# Patient Record
Sex: Male | Born: 1970 | Race: Black or African American | Hispanic: No | Marital: Married | Smoking: Never smoker
Health system: Southern US, Community
[De-identification: ages and names within clinical notes are randomized; demographics above are authoritative.]

## PROBLEM LIST (undated history)

## (undated) DIAGNOSIS — R0789 Other chest pain: Secondary | ICD-10-CM

## (undated) DIAGNOSIS — M778 Other enthesopathies, not elsewhere classified: Secondary | ICD-10-CM

## (undated) DIAGNOSIS — L80 Vitiligo: Secondary | ICD-10-CM

## (undated) DIAGNOSIS — M5431 Sciatica, right side: Secondary | ICD-10-CM

## (undated) DIAGNOSIS — R7303 Prediabetes: Secondary | ICD-10-CM

## (undated) DIAGNOSIS — K219 Gastro-esophageal reflux disease without esophagitis: Secondary | ICD-10-CM

## (undated) HISTORY — DX: Sciatica, right side: M54.31

## (undated) HISTORY — DX: Prediabetes: R73.03

## (undated) HISTORY — DX: Vitiligo: L80

## (undated) HISTORY — PX: KNEE SURGERY: SHX244

## (undated) HISTORY — DX: Other chest pain: R07.89

## (undated) HISTORY — DX: Other enthesopathies, not elsewhere classified: M77.8

---

## 2014-01-10 ENCOUNTER — Emergency Department (HOSPITAL_COMMUNITY): Payer: 59

## 2014-01-10 ENCOUNTER — Emergency Department (HOSPITAL_COMMUNITY)
Admission: EM | Admit: 2014-01-10 | Discharge: 2014-01-10 | Disposition: A | Discharge status: Home / Self Care | Payer: 59 | Attending: Emergency Medicine | Admitting: Emergency Medicine

## 2014-01-10 ENCOUNTER — Encounter (HOSPITAL_COMMUNITY): Payer: Self-pay | Admitting: Emergency Medicine

## 2014-01-10 DIAGNOSIS — Z79899 Other long term (current) drug therapy: Secondary | ICD-10-CM | POA: Insufficient documentation

## 2014-01-10 DIAGNOSIS — Z880 Allergy status to penicillin: Secondary | ICD-10-CM | POA: Insufficient documentation

## 2014-01-10 DIAGNOSIS — M79622 Pain in left upper arm: Secondary | ICD-10-CM

## 2014-01-10 DIAGNOSIS — R209 Unspecified disturbances of skin sensation: Secondary | ICD-10-CM | POA: Insufficient documentation

## 2014-01-10 DIAGNOSIS — R110 Nausea: Secondary | ICD-10-CM | POA: Insufficient documentation

## 2014-01-10 DIAGNOSIS — K219 Gastro-esophageal reflux disease without esophagitis: Secondary | ICD-10-CM | POA: Insufficient documentation

## 2014-01-10 DIAGNOSIS — R0602 Shortness of breath: Secondary | ICD-10-CM | POA: Insufficient documentation

## 2014-01-10 DIAGNOSIS — M25519 Pain in unspecified shoulder: Secondary | ICD-10-CM | POA: Insufficient documentation

## 2014-01-10 HISTORY — DX: Gastro-esophageal reflux disease without esophagitis: K21.9

## 2014-01-10 LAB — CBC WITH DIFFERENTIAL/PLATELET
Basophils Absolute: 0 10*3/uL (ref 0.0–0.1)
Basophils Relative: 0 % (ref 0–1)
Eosinophils Absolute: 0.1 10*3/uL (ref 0.0–0.7)
Eosinophils Relative: 1 % (ref 0–5)
HEMATOCRIT: 38.2 % — AB (ref 39.0–52.0)
HEMOGLOBIN: 12.4 g/dL — AB (ref 13.0–17.0)
LYMPHS ABS: 2.4 10*3/uL (ref 0.7–4.0)
Lymphocytes Relative: 30 % (ref 12–46)
MCH: 25.8 pg — ABNORMAL LOW (ref 26.0–34.0)
MCHC: 32.5 g/dL (ref 30.0–36.0)
MCV: 79.6 fL (ref 78.0–100.0)
MONO ABS: 0.6 10*3/uL (ref 0.1–1.0)
MONOS PCT: 7 % (ref 3–12)
NEUTROS ABS: 4.9 10*3/uL (ref 1.7–7.7)
NEUTROS PCT: 62 % (ref 43–77)
Platelets: 281 10*3/uL (ref 150–400)
RBC: 4.8 MIL/uL (ref 4.22–5.81)
RDW: 15.5 % (ref 11.5–15.5)
WBC: 8 10*3/uL (ref 4.0–10.5)

## 2014-01-10 LAB — COMPREHENSIVE METABOLIC PANEL
ALBUMIN: 3.9 g/dL (ref 3.5–5.2)
ALT: 10 U/L (ref 0–53)
AST: 23 U/L (ref 0–37)
Alkaline Phosphatase: 107 U/L (ref 39–117)
BILIRUBIN TOTAL: 0.3 mg/dL (ref 0.3–1.2)
BUN: 14 mg/dL (ref 6–23)
CHLORIDE: 101 meq/L (ref 96–112)
CO2: 24 mEq/L (ref 19–32)
Calcium: 9.2 mg/dL (ref 8.4–10.5)
Creatinine, Ser: 1.22 mg/dL (ref 0.50–1.35)
GFR, EST AFRICAN AMERICAN: 83 mL/min — AB (ref >90)
GFR, EST NON AFRICAN AMERICAN: 72 mL/min — AB (ref >90)
GLUCOSE: 101 mg/dL — AB (ref 70–99)
Potassium: 4.3 mEq/L (ref 3.7–5.3)
Sodium: 139 mEq/L (ref 137–147)
Total Protein: 7.7 g/dL (ref 6.0–8.3)

## 2014-01-10 LAB — TROPONIN I

## 2014-01-10 MED ORDER — NITROGLYCERIN 0.4 MG SL SUBL
0.4000 mg | SUBLINGUAL_TABLET | Freq: Once | SUBLINGUAL | Status: AC
  Administered 2014-01-10: 0.4 mg via SUBLINGUAL
  Filled 2014-01-10: qty 25

## 2014-01-10 MED ORDER — PANTOPRAZOLE SODIUM 20 MG PO TBEC: 20.0000 mg | DELAYED_RELEASE_TABLET | Freq: Two times a day (BID) | ORAL | Status: DC

## 2014-01-10 NOTE — ED Provider Notes (Signed)
CSN: [id number]     Arrival date & time 01/10/14  1711 History   First MD Initiated Contact with Patient 01/10/14 1815     Chief Complaint  Patient presents with  . Arm Pain  . episodes of numbness    (Consider location/radiation/quality/duration/timing/severity/associated sxs/prior Treatment) Patient is a 42 y.o. male presenting with arm pain. The history is provided by the patient and the spouse. No language interpreter was used.  Arm Pain This is a new problem. The current episode started 1 to 4 weeks ago. Episode frequency: Pain has been intermittent until 10 days ago when it became more constant.  Associated symptoms include nausea. Pertinent negatives include no abdominal pain, chest pain, chills, diaphoresis, fever or vomiting. Associated symptoms comments: Left arm aching for the past several weeks. He denies chest pain but reports SOB and nausea. Symptoms can occur at rest or while active. No history of same. He takes medication for GERD and has has increased symptoms of heartburn as well. No cough or fever. .    Past Medical History  Diagnosis Date  . GERD (gastroesophageal reflux disease)    History reviewed. No pertinent past surgical history. History reviewed. No pertinent family history. History  Substance Use Topics  . Smoking status: Never Smoker   . Smokeless tobacco: Not on file  . Alcohol Use: Yes     Comment: socially    Review of Systems  Constitutional: Negative for fever, chills and diaphoresis.  Respiratory: Positive for shortness of breath.   Cardiovascular: Negative.  Negative for chest pain and palpitations.  Gastrointestinal: Positive for nausea. Negative for vomiting and abdominal pain.  Musculoskeletal:       See HPI.  Skin: Negative.   Neurological: Negative.     Allergies  Penicillins  Home Medications   Current Outpatient Rx  Name  Route  Sig  Dispense  Refill  . lansoprazole (PREVACID) 30 MG capsule   Oral   Take 30 mg by mouth daily  at 12 noon.         . pantoprazole (PROTONIX) 40 MG injection   Intravenous   Inject 40 mg into the vein every morning.          BP 131/75  Pulse 92  Temp(Src) 98.3 F (36.8 C) (Oral)  Resp 16  SpO2 96% Physical Exam  Constitutional: He is oriented to person, place, and time. He appears well-developed and well-nourished. No distress.  HENT:  Head: Normocephalic.  Neck: Normal range of motion. Neck supple.  Cardiovascular: Normal rate and regular rhythm.   Pulmonary/Chest: Effort normal and breath sounds normal. He has no wheezes. He has no rales. He exhibits no tenderness.  Abdominal: Soft. Bowel sounds are normal. There is no tenderness. There is no rebound and no guarding.  Musculoskeletal: Normal range of motion.  FROM Left UE. No swelling or discoloration.  Neurological: He is alert and oriented to person, place, and time.  Skin: Skin is warm and dry. No rash noted.  Psychiatric: He has a normal mood and affect.    ED Course  Procedures (including critical care time) Labs Review Labs Reviewed  CBC WITH DIFFERENTIAL - Abnormal; Notable for the following:    Hemoglobin 12.4 (*)    HCT 38.2 (*)    MCH 25.8 (*)    All other components within normal limits  COMPREHENSIVE METABOLIC PANEL - Abnormal; Notable for the following:    Glucose, Bld 101 (*)    GFR calc non Af Amer 72 (*)  GFR calc Af Amer 83 (*)    All other components within normal limits  TROPONIN I   Results for orders placed during the hospital encounter of 01/10/14  CBC WITH DIFFERENTIAL      Result Value Range   WBC 8.0  4.0 - 10.5 K/uL   RBC 4.80  4.22 - 5.81 MIL/uL   Hemoglobin 12.4 (*) 13.0 - 17.0 g/dL   HCT 38.2 (*) 39.0 - 52.0 %   MCV 79.6  78.0 - 100.0 fL   MCH 25.8 (*) 26.0 - 34.0 pg   MCHC 32.5  30.0 - 36.0 g/dL   RDW 15.5  11.5 - 15.5 %   Platelets 281  150 - 400 K/uL   Neutrophils Relative % 62  43 - 77 %   Neutro Abs 4.9  1.7 - 7.7 K/uL   Lymphocytes Relative 30  12 - 46 %    Lymphs Abs 2.4  0.7 - 4.0 K/uL   Monocytes Relative 7  3 - 12 %   Monocytes Absolute 0.6  0.1 - 1.0 K/uL   Eosinophils Relative 1  0 - 5 %   Eosinophils Absolute 0.1  0.0 - 0.7 K/uL   Basophils Relative 0  0 - 1 %   Basophils Absolute 0.0  0.0 - 0.1 K/uL  COMPREHENSIVE METABOLIC PANEL      Result Value Range   Sodium 139  137 - 147 mEq/L   Potassium 4.3  3.7 - 5.3 mEq/L   Chloride 101  96 - 112 mEq/L   CO2 24  19 - 32 mEq/L   Glucose, Bld 101 (*) 70 - 99 mg/dL   BUN 14  6 - 23 mg/dL   Creatinine, Ser 1.22  0.50 - 1.35 mg/dL   Calcium 9.2  8.4 - 10.5 mg/dL   Total Protein 7.7  6.0 - 8.3 g/dL   Albumin 3.9  3.5 - 5.2 g/dL   AST 23  0 - 37 U/L   ALT 10  0 - 53 U/L   Alkaline Phosphatase 107  39 - 117 U/L   Total Bilirubin 0.3  0.3 - 1.2 mg/dL   GFR calc non Af Amer 72 (*) >90 mL/min   GFR calc Af Amer 83 (*) >90 mL/min  TROPONIN I      Result Value Range   Troponin I <0.30  <0.30 ng/mL    Imaging Review Dg Chest Portable 1 View  01/10/2014   CLINICAL DATA:  ARM PAIN  EXAM: PORTABLE CHEST - 1 VIEW  COMPARISON:  None.  FINDINGS: Normal mediastinum and cardiac silhouette. Normal pulmonary vasculature. No evidence of effusion, infiltrate, or pneumothorax. No acute bony abnormality.  IMPRESSION: No acute cardiopulmonary process.   Electronically Signed   By: Suzy Bouchard M.D.   On: 01/10/2014 18:45    EKG Interpretation   None       MDM  No diagnosis found. 1. Left UE pain  Symptoms for over one week that atypical for CAD, negative troponin, EKG - doubt ACS. Heart score 1. VSS. Discussed with Dr. Audie Pinto. Will discharge home to follow up with PCP.  Dewaine Oats, PA-C 01/10/14 2036

## 2014-01-10 NOTE — ED Notes (Signed)
Pt reports left arm pain x1 week, first in shoulder and now located in left arm/elbow. Pt also has been experiencing dizziness when rising from sitting position, and numbness in extremities x6 months, has not been seen by doctor for any symptoms. Arm pain 8/10.

## 2014-01-10 NOTE — Discharge Instructions (Signed)

## 2014-01-10 NOTE — ED Notes (Signed)
On assessment patient states he has been SOB on exertion, when he bend over and lay down. Patient wife states she noticed him gasping for air in the night.

## 2014-10-21 ENCOUNTER — Ambulatory Visit (HOSPITAL_COMMUNITY)
Admission: RE | Admit: 2014-10-21 | Discharge: 2014-10-21 | Disposition: A | Discharge status: Home / Self Care | Payer: 59 | Source: Ambulatory Visit | Attending: Internal Medicine | Admitting: Internal Medicine

## 2014-10-21 ENCOUNTER — Other Ambulatory Visit (HOSPITAL_COMMUNITY): Payer: Self-pay | Admitting: Internal Medicine

## 2014-10-21 DIAGNOSIS — R52 Pain, unspecified: Secondary | ICD-10-CM

## 2014-10-21 DIAGNOSIS — M25551 Pain in right hip: Secondary | ICD-10-CM | POA: Insufficient documentation

## 2018-06-02 ENCOUNTER — Ambulatory Visit (INDEPENDENT_AMBULATORY_CARE_PROVIDER_SITE_OTHER): Payer: Self-pay | Admitting: Urgent Care

## 2018-06-02 ENCOUNTER — Other Ambulatory Visit: Payer: Self-pay | Admitting: *Deleted

## 2018-06-02 ENCOUNTER — Encounter: Payer: Self-pay | Admitting: Urgent Care

## 2018-06-02 VITALS — BP 173/80 | HR 80 | Temp 98.1°F | Resp 18 | Ht >= 80 in | Wt 375.4 lb

## 2018-06-02 DIAGNOSIS — R030 Elevated blood-pressure reading, without diagnosis of hypertension: Secondary | ICD-10-CM

## 2018-06-02 DIAGNOSIS — Z024 Encounter for examination for driving license: Secondary | ICD-10-CM

## 2018-06-02 NOTE — Patient Instructions (Addendum)
Hypertension Hypertension, commonly called high blood pressure, is when the force of blood pumping through the arteries is too strong. The arteries are the blood vessels that carry blood from the heart throughout the body. Hypertension forces the heart to work harder to pump blood and may cause arteries to become narrow or stiff. Having untreated or uncontrolled hypertension can cause heart attacks, strokes, kidney disease, and other problems. A blood pressure reading consists of a higher number over a lower number. Ideally, your blood pressure should be below 120/80. The first ("top") number is called the systolic pressure. It is a measure of the pressure in your arteries as your heart beats. The second ("bottom") number is called the diastolic pressure. It is a measure of the pressure in your arteries as the heart relaxes. What are the causes? The cause of this condition is not known. What increases the risk? Some risk factors for high blood pressure are under your control. Others are not. Factors you can change  Smoking.  Having type 2 diabetes mellitus, high cholesterol, or both.  Not getting enough exercise or physical activity.  Being overweight.  Having too much fat, sugar, calories, or salt (sodium) in your diet.  Drinking too much alcohol. Factors that are difficult or impossible to change  Having chronic kidney disease.  Having a family history of high blood pressure.  Age. Risk increases with age.  Race. You may be at higher risk if you are African-American.  Gender. Men are at higher risk than women before age 45. After age 65, women are at higher risk than men.  Having obstructive sleep apnea.  Stress. What are the signs or symptoms? Extremely high blood pressure (hypertensive crisis) may cause:  Headache.  Anxiety.  Shortness of breath.  Nosebleed.  Nausea and vomiting.  Severe chest pain.  Jerky movements you cannot control (seizures).  How is this  diagnosed? This condition is diagnosed by measuring your blood pressure while you are seated, with your arm resting on a surface. The cuff of the blood pressure monitor will be placed directly against the skin of your upper arm at the level of your heart. It should be measured at least twice using the same arm. Certain conditions can cause a difference in blood pressure between your right and left arms. Certain factors can cause blood pressure readings to be lower or higher than normal (elevated) for a short period of time:  When your blood pressure is higher when you are in a health care provider's office than when you are at home, this is called white coat hypertension. Most people with this condition do not need medicines.  When your blood pressure is higher at home than when you are in a health care provider's office, this is called masked hypertension. Most people with this condition may need medicines to control blood pressure.  If you have a high blood pressure reading during one visit or you have normal blood pressure with other risk factors:  You may be asked to return on a different day to have your blood pressure checked again.  You may be asked to monitor your blood pressure at home for 1 week or longer.  If you are diagnosed with hypertension, you may have other blood or imaging tests to help your health care provider understand your overall risk for other conditions. How is this treated? This condition is treated by making healthy lifestyle changes, such as eating healthy foods, exercising more, and reducing your alcohol intake. Your   health care provider may prescribe medicine if lifestyle changes are not enough to get your blood pressure under control, and if:  Your systolic blood pressure is above 130.  Your diastolic blood pressure is above 80.  Your personal target blood pressure may vary depending on your medical conditions, your age, and other factors. Follow these  instructions at home: Eating and drinking  Eat a diet that is high in fiber and potassium, and low in sodium, added sugar, and fat. An example eating plan is called the DASH (Dietary Approaches to Stop Hypertension) diet. To eat this way: ? Eat plenty of fresh fruits and vegetables. Try to fill half of your plate at each meal with fruits and vegetables. ? Eat whole grains, such as whole wheat pasta, brown rice, or whole grain bread. Fill about one quarter of your plate with whole grains. ? Eat or drink low-fat dairy products, such as skim milk or low-fat yogurt. ? Avoid fatty cuts of meat, processed or cured meats, and poultry with skin. Fill about one quarter of your plate with lean proteins, such as fish, chicken without skin, beans, eggs, and tofu. ? Avoid premade and processed foods. These tend to be higher in sodium, added sugar, and fat.  Reduce your daily sodium intake. Most people with hypertension should eat less than 1,500 mg of sodium a day.  Limit alcohol intake to no more than 1 drink a day for nonpregnant women and 2 drinks a day for men. One drink equals 12 oz of beer, 5 oz of wine, or 1 oz of hard liquor. Lifestyle  Work with your health care provider to maintain a healthy body weight or to lose weight. Ask what an ideal weight is for you.  Get at least 30 minutes of exercise that causes your heart to beat faster (aerobic exercise) most days of the week. Activities may include walking, swimming, or biking.  Include exercise to strengthen your muscles (resistance exercise), such as pilates or lifting weights, as part of your weekly exercise routine. Try to do these types of exercises for 30 minutes at least 3 days a week.  Do not use any products that contain nicotine or tobacco, such as cigarettes and e-cigarettes. If you need help quitting, ask your health care provider.  Monitor your blood pressure at home as told by your health care provider.  Keep all follow-up visits as  told by your health care provider. This is important. Medicines  Take over-the-counter and prescription medicines only as told by your health care provider. Follow directions carefully. Blood pressure medicines must be taken as prescribed.  Do not skip doses of blood pressure medicine. Doing this puts you at risk for problems and can make the medicine less effective.  Ask your health care provider about side effects or reactions to medicines that you should watch for. Contact a health care provider if:  You think you are having a reaction to a medicine you are taking.  You have headaches that keep coming back (recurring).  You feel dizzy.  You have swelling in your ankles.  You have trouble with your vision. Get help right away if:  You develop a severe headache or confusion.  You have unusual weakness or numbness.  You feel faint.  You have severe pain in your chest or abdomen.  You vomit repeatedly.  You have trouble breathing. Summary  Hypertension is when the force of blood pumping through your arteries is too strong. If this condition is not   controlled, it may put you at risk for serious complications.  Your personal target blood pressure may vary depending on your medical conditions, your age, and other factors. For most people, a normal blood pressure is less than 120/80.  Hypertension is treated with lifestyle changes, medicines, or a combination of both. Lifestyle changes include weight loss, eating a healthy, low-sodium diet, exercising more, and limiting alcohol. This information is not intended to replace advice given to you by your health care provider. Make sure you discuss any questions you have with your health care provider. Document Released: 12/10/2005 Document Revised: 11/07/2016 Document Reviewed: 11/07/2016 Elsevier Interactive Patient Education  2018 [employer].     IF you received an x-ray today, you will receive an invoice from [facility]. Please contact [facility] at [phone] with questions or concerns regarding your invoice.   IF you received labwork today, you will receive an invoice from [facility]. Please contact [facility] at [phone] with questions or concerns regarding your invoice.   Our billing staff will not be able to assist you with questions regarding bills from these companies.  You will be contacted with the lab results as soon as they are available. The fastest way to get your results is to activate your My Chart account. Instructions are located on the last page of this paperwork. If you have not heard from us regarding the results in 2 weeks, please contact this office.     

## 2018-06-02 NOTE — Progress Notes (Signed)
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 The patient reports that he finished a steroid course for back pain about a week and a half ago.  Denies ever having been diagnosed with high blood pressure, hypertension.  He is not currently taking any medications for this. Denies dizziness, chronic headache, blurred vision, chest pain, shortness of breath, heart racing, palpitations, nausea, vomiting, abdominal pain, hematuria, lower leg swelling. Denies smoking cigarettes or drinking alcohol.  He is scheduled to have a knee surgery in the next couple weeks.  The following portions of the patient's history were reviewed and updated as appropriate: allergies, current medications, past family history, past medical history, past social history and past surgical history.  Objective:   BP (!) 173/80   Pulse 80   Temp 98.1 F (36.7 C) (Oral)   Resp 18   Ht 6\' 8"  (2.032 m)   Wt (!) 375 lb 6.4 oz (170.3 kg)   SpO2 98%   BMI 41.24 kg/m   BP Readings from Last 3 Encounters:  06/02/18 (!) 173/80  01/10/14 131/75    Vision/hearing:  Visual Acuity Screening   Right eye Left eye Both eyes  Without correction:     With correction: 20/20 20/20 20/15   Comments: Peripheral Vision: Right eye 85 degrees. Left eye 85 degrees.  The patient can distinguish the colors red, amber and green.  Hearing Screening Comments: The patient was able to hear a forced whisper from 10 feet.  Patient can recognize and distinguish among traffic control signals and devices showing standard red, green, and amber colors.  Corrective lenses required: Yes  Monocular Vision?: No  Hearing aid requirement: No  Physical Exam  Constitutional: He is oriented to person, place, and time. He appears well-developed and well-nourished.  HENT:  TM's intact bilaterally, no effusions or erythema. Nasal turbinates pink and moist, nasal passages patent. No  sinus tenderness. Oropharynx clear, mucous membranes moist, dentition in good repair.  Eyes: Pupils are equal, round, and reactive to light. Conjunctivae and EOM are normal. Right eye exhibits no discharge. Left eye exhibits no discharge. No scleral icterus.  Neck: Normal range of motion. Neck supple.  Cardiovascular: Normal rate, regular rhythm and intact distal pulses. Exam reveals no gallop and no friction rub.  No murmur heard. Pulmonary/Chest: No stridor. No respiratory distress. He has no wheezes. He has no rales.  Abdominal: Soft. Bowel sounds are normal. He exhibits no distension and no mass. There is no tenderness.  Musculoskeletal: Normal range of motion. He exhibits no edema or tenderness.  Lymphadenopathy:    He has no cervical adenopathy.  Neurological: He is alert and oriented to person, place, and time. He has normal reflexes. He displays normal reflexes. Coordination normal.  Skin: Skin is warm and dry. No rash noted. No erythema. No pallor.  Psychiatric: He has a normal mood and affect.   Labs:    Assessment:    Healthy male exam.  Meets standards, but periodic monitoring required due to elevated blood pressure reading.  Driver qualified only for 3 months.    Plan:   Medical examiners certificate completed and printed. Return as needed.  Patient will return to clinic in 2 months for recheck on his blood pressure.  If he is not on any blood pressure medication at that point is completely back to normal then we can extend his certificate for 2 years from today's date.  Otherwise  we will try to manage him for hypertension and make adjustments as necessary for his certificate.  Patient verbalized understanding and is in agreement with assessment and plan.  Jaynee Eagles, PA-C [facility] [phone] 06/02/2018  12:20 PM

## 2018-08-05 ENCOUNTER — Ambulatory Visit (INDEPENDENT_AMBULATORY_CARE_PROVIDER_SITE_OTHER): Payer: Self-pay | Admitting: Urgent Care

## 2018-08-05 ENCOUNTER — Encounter: Payer: Self-pay | Admitting: Urgent Care

## 2018-08-05 VITALS — BP 118/76

## 2018-08-05 DIAGNOSIS — Z024 Encounter for examination for driving license: Secondary | ICD-10-CM

## 2018-08-05 NOTE — Patient Instructions (Signed)
     IF you received an x-ray today, you will receive an invoice from [facility]. Please contact [facility] at [phone] with questions or concerns regarding your invoice.   IF you received labwork today, you will receive an invoice from [facility]. Please contact [facility] at [phone] with questions or concerns regarding your invoice.   Our billing staff will not be able to assist you with questions regarding bills from these companies.  You will be contacted with the lab results as soon as they are available. The fastest way to get your results is to activate your My Chart account. Instructions are located on the last page of this paperwork. If you have not heard from us regarding the results in 2 weeks, please contact this office.     

## 2018-08-05 NOTE — Progress Notes (Signed)
Patient extended to 2 year certificate.  BP Readings from Last 3 Encounters:  08/05/18 (!) 165/78  06/02/18 (!) 173/80  01/10/14 131/75

## 2019-02-17 ENCOUNTER — Encounter (HOSPITAL_COMMUNITY): Payer: Self-pay | Admitting: *Deleted

## 2019-02-17 ENCOUNTER — Emergency Department (HOSPITAL_COMMUNITY)
Admission: EM | Admit: 2019-02-17 | Discharge: 2019-02-17 | Disposition: A | Discharge status: Home / Self Care | Payer: PRIVATE HEALTH INSURANCE | Attending: Emergency Medicine | Admitting: Emergency Medicine

## 2019-02-17 ENCOUNTER — Emergency Department (HOSPITAL_COMMUNITY): Payer: PRIVATE HEALTH INSURANCE

## 2019-02-17 ENCOUNTER — Other Ambulatory Visit: Payer: Self-pay

## 2019-02-17 DIAGNOSIS — Z79899 Other long term (current) drug therapy: Secondary | ICD-10-CM | POA: Diagnosis not present

## 2019-02-17 DIAGNOSIS — M791 Myalgia, unspecified site: Secondary | ICD-10-CM | POA: Insufficient documentation

## 2019-02-17 DIAGNOSIS — R05 Cough: Secondary | ICD-10-CM | POA: Insufficient documentation

## 2019-02-17 DIAGNOSIS — R600 Localized edema: Secondary | ICD-10-CM | POA: Insufficient documentation

## 2019-02-17 DIAGNOSIS — R911 Solitary pulmonary nodule: Secondary | ICD-10-CM | POA: Diagnosis not present

## 2019-02-17 DIAGNOSIS — R072 Precordial pain: Secondary | ICD-10-CM | POA: Insufficient documentation

## 2019-02-17 DIAGNOSIS — R059 Cough, unspecified: Secondary | ICD-10-CM

## 2019-02-17 LAB — COMPREHENSIVE METABOLIC PANEL
ALT: 12 U/L (ref 0–44)
ANION GAP: 7 (ref 5–15)
AST: 23 U/L (ref 15–41)
Albumin: 3.9 g/dL (ref 3.5–5.0)
Alkaline Phosphatase: 103 U/L (ref 38–126)
BUN: 18 mg/dL (ref 6–20)
CO2: 25 mmol/L (ref 22–32)
Calcium: 8.9 mg/dL (ref 8.9–10.3)
Chloride: 104 mmol/L (ref 98–111)
Creatinine, Ser: 1.2 mg/dL (ref 0.61–1.24)
GFR calc Af Amer: >60 mL/min (ref >60)
GFR calc non Af Amer: >60 mL/min (ref >60)
Glucose, Bld: 127 mg/dL — ABNORMAL HIGH (ref 70–99)
POTASSIUM: 4 mmol/L (ref 3.5–5.1)
SODIUM: 136 mmol/L (ref 135–145)
Total Bilirubin: 0.3 mg/dL (ref 0.3–1.2)
Total Protein: 7.5 g/dL (ref 6.5–8.1)

## 2019-02-17 LAB — CBC
HCT: 39.3 % (ref 39.0–52.0)
Hemoglobin: 11.7 g/dL — ABNORMAL LOW (ref 13.0–17.0)
MCH: 24.5 pg — ABNORMAL LOW (ref 26.0–34.0)
MCHC: 29.8 g/dL — ABNORMAL LOW (ref 30.0–36.0)
MCV: 82.2 fL (ref 80.0–100.0)
PLATELETS: 246 10*3/uL (ref 150–400)
RBC: 4.78 MIL/uL (ref 4.22–5.81)
RDW: 15.9 % — ABNORMAL HIGH (ref 11.5–15.5)
WBC: 6.8 10*3/uL (ref 4.0–10.5)
nRBC: 0 % (ref 0.0–0.2)

## 2019-02-17 LAB — D-DIMER, QUANTITATIVE: D-Dimer, Quant: 0.59 ug/mL-FEU — ABNORMAL HIGH (ref 0.00–0.50)

## 2019-02-17 LAB — I-STAT TROPONIN, ED: TROPONIN I, POC: 0 ng/mL (ref 0.00–0.08)

## 2019-02-17 MED ORDER — IOPAMIDOL (ISOVUE-370) INJECTION 76%
100.0000 mL | Freq: Once | INTRAVENOUS | Status: AC | PRN
  Administered 2019-02-17: 100 mL via INTRAVENOUS

## 2019-02-17 MED ORDER — SODIUM CHLORIDE (PF) 0.9 % IJ SOLN
INTRAMUSCULAR | Status: AC
  Filled 2019-02-17: qty 50

## 2019-02-17 MED ORDER — IOPAMIDOL (ISOVUE-370) INJECTION 76%
INTRAVENOUS | Status: AC
  Administered 2019-02-17: 100 mL via INTRAVENOUS
  Filled 2019-02-17: qty 100

## 2019-02-17 MED ORDER — ONDANSETRON HCL 4 MG/2ML IJ SOLN
4.0000 mg | Freq: Once | INTRAMUSCULAR | Status: AC
  Administered 2019-02-17: 4 mg via INTRAVENOUS
  Filled 2019-02-17: qty 2

## 2019-02-17 MED ORDER — SODIUM CHLORIDE 0.9% FLUSH
3.0000 mL | Freq: Once | INTRAVENOUS | Status: AC
  Administered 2019-02-17: 3 mL via INTRAVENOUS

## 2019-02-17 MED ORDER — BENZONATATE 100 MG PO CAPS: 200.0000 mg | ORAL_CAPSULE | Freq: Two times a day (BID) | ORAL | 0 refills | Status: DC | PRN

## 2019-02-17 MED ORDER — MORPHINE SULFATE (PF) 4 MG/ML IV SOLN
4.0000 mg | Freq: Once | INTRAVENOUS | Status: AC
  Administered 2019-02-17: 4 mg via INTRAVENOUS
  Filled 2019-02-17: qty 1

## 2019-02-17 NOTE — ED Triage Notes (Signed)
Pt stated "the chest pain just woke me up out of my sleep.  My ribs hurt more than anything.  I've been having a cough and aching all over x 1 month.  I went to my doctor & he just gave me some pills for congestion."  Pt denies vomiting but admits to having nausea &SOB.

## 2019-02-17 NOTE — ED Provider Notes (Signed)
Care assumed from [place], please see his note for full details, but in brief Mike Reyes is a 48 y.o. male who presents for evaluation of chest pain over his inferior chest wall which is worse with palpation and movement.  He has had a worsening cough over the past month but also travels 3 to 4 hours daily in a car for work.  No history of PE or DVT but does have intermittent lower extremity swelling.  Initial work-up reassuring with no EKG changes, chest pain not consistent with ACS.  D-dimer was mildly elevated, and CT angio study of the chest has been ordered to rule out PE, but if this is negative then this thought to likely be chest wall pain in the setting of cough and patient can be discharged home with Mike Reyes and PCP follow-up.  Physical Exam  BP (!) 150/88 (BP Location: Left Arm)   Pulse 72   Temp 97.8 F (36.6 C) (Oral)   Resp 14   SpO2 100%   Physical Exam Vitals signs and nursing note reviewed.  Constitutional:      General: He is not in acute distress.    Appearance: He is well-developed. He is obese. He is not diaphoretic.  HENT:     Head: Normocephalic and atraumatic.  Eyes:     General:        Right eye: No discharge.        Left eye: No discharge.  Cardiovascular:     Rate and Rhythm: Normal rate and regular rhythm.  Pulmonary:     Effort: Pulmonary effort is normal. No tachypnea, accessory muscle usage or respiratory distress.  Neurological:     Mental Status: He is alert.     Coordination: Coordination normal.  Psychiatric:        Mood and Affect: Mood normal.        Behavior: Behavior normal.     ED Course/Procedures   Labs Reviewed  CBC - Abnormal; Notable for the following components:      Result Value   Hemoglobin 11.7 (*)    MCH 24.5 (*)    MCHC 29.8 (*)    RDW 15.9 (*)    All other components within normal limits  COMPREHENSIVE METABOLIC PANEL - Abnormal; Notable for the following components:   Glucose, Bld 127 (*)    All other  components within normal limits  D-DIMER, QUANTITATIVE (NOT AT [facility]) - Abnormal; Notable for the following components:   D-Dimer, Quant 0.59 (*)    All other components within normal limits  I-STAT TROPONIN, ED    Dg Chest 2 View  Result Date: 02/17/2019 CLINICAL DATA:  48 year old male with chest pain. EXAM: CHEST - 2 VIEW COMPARISON:  Chest radiograph dated 01/10/2014 FINDINGS: The heart size and mediastinal contours are within normal limits. Both lungs are clear. The visualized skeletal structures are unremarkable. IMPRESSION: No active cardiopulmonary disease. Electronically Signed   By: Anner Crete M.D.   On: 02/17/2019 05:51   Ct Angio Chest Pe W And/or Wo Contrast  Result Date: 02/17/2019 CLINICAL DATA:  Chest pain EXAM: CT ANGIOGRAPHY CHEST WITH CONTRAST TECHNIQUE: Multidetector CT imaging of the chest was performed using the standard protocol during bolus administration of intravenous contrast. Multiplanar CT image reconstructions and MIPs were obtained to evaluate the vascular anatomy. CONTRAST:  138mL ISOVUE-370 IOPAMIDOL (ISOVUE-370) INJECTION 76% COMPARISON:  Chest radiograph February 17, 2019 FINDINGS: Cardiovascular: There is no demonstrable pulmonary embolus. There is no thoracic aortic aneurysm  or dissection. The visualized great vessels appear normal. There is no pericardial effusion or pericardial thickening evident. Mediastinum/Nodes: Visualized thyroid appears normal. There is no appreciable thoracic adenopathy. There is a small hiatal hernia. Lungs/Pleura: There is mild scarring and bullae in the extreme lung apices. A small bulla is noted in the medial aspect of the anterior segment right upper lobe more inferiorly. There is mild bibasilar atelectasis. There is no appreciable edema or consolidation. No pleural effusion or pleural thickening is evident. On axial slice 39 series 6, there is a 4 mm nodular opacity in the anterior segment of the left upper lobe. Mediastinum Upper  Abdomen: Visualized upper abdominal structures appear unremarkable. Musculoskeletal: There are no blastic or lytic bone lesions. No evident chest wall lesions. Review of the MIP images confirms the above findings. IMPRESSION: 1. No demonstrable pulmonary embolus. No thoracic aortic aneurysm or dissection. 2. Mild bibasilar atelectasis. No edema or consolidation. 4 mm nodular opacity anterior segment left upper lobe. No follow-up needed if patient is low-risk. Non-contrast chest CT can be considered in 12 months if patient is high-risk. This recommendation follows the consensus statement: Guidelines for Management of Incidental Pulmonary Nodules Detected on CT Images: From the Fleischner Society 2017; Radiology 2017; 284:228-243. 3.  Small hiatal hernia. 4.  No demonstrable adenopathy. Electronically Signed   By: Lowella Grip III M.D.   On: 02/17/2019 07:03     Procedures  MDM   CT PE study shows no evidence of pulmonary embolus.  There is no edema or consolidation.  Only finding is a 4 mm nodule opacity in the left upper lobe I have discussed this with the patient he will follow-up with his primary care doctor for monitoring of this.  Pain is likely musculoskeletal chest wall pain related to patient's cough.  He will be discharged home with [facility].  Will need to follow-up with his primary care doctor.  I discussed appropriate return precautions with the patient he expresses understanding and agreement with plan.  Discharged home at this time.  Final diagnoses:  Precordial pain  Cough  Pulmonary nodule    ED Discharge Orders         Ordered    benzonatate (TESSALON) 100 MG capsule  2 times daily PRN     02/17/19 0608               Jacqlyn Larsen, PA-C 02/17/19 0729    Ward, Delice Bison, DO 02/17/19 [phone]

## 2019-02-17 NOTE — Discharge Instructions (Addendum)
Your work-up today is reassuring, I suspect your pain is skill skeletal and related to your cough, use cough medication provided.  Your CT scan did show a small 4 mm nodule in the left upper lobe, please follow-up with your primary care doctor regarding this so they can recommend appropriate monitoring.

## 2019-02-17 NOTE — ED Provider Notes (Signed)
[facility] DEPT Provider Note   CSN: [id number] Arrival date & time: 02/17/19  0441    History   Chief Complaint Chief Complaint  Patient presents with  . Chest Pain  . Generalized Body Aches    x 1.5 months    HPI Mike Reyes is a 48 y.o. male.     Patient presents to the emergency department with a chief complaint of chest pain.  He states that the pain awoke him from sleep.  He describes pain as being along his inferior chest wall.  It is worsened with palpation.  He reports coughing for the past month.  He reports some associated shortness of breath.  Denies any fevers or chills.  Denies any history of ACS.  He does travel 3 to 4 hours on almost a daily basis for work, but has never had a PE or DVT.  He states that he does have intermittent lower extremity swelling.  He has not taken anything for symptoms.  He rates his pain is moderate.  Denies any other associated symptoms.  The history is provided by the patient. No language interpreter was used.    Past Medical History:  Diagnosis Date  . GERD (gastroesophageal reflux disease)     There are no active problems to display for this patient.   Past Surgical History:  Procedure Laterality Date  . KNEE SURGERY          Home Medications    Prior to Admission medications   Medication Sig Start Date End Date Taking? Authorizing Provider  pantoprazole (PROTONIX) 20 MG tablet Take by mouth.    [provider]  tiZANidine (ZANAFLEX) 2 MG tablet Take by mouth.    [provider]    Family History No family history on file.  Social History Social History   Tobacco Use  . Smoking status: Never Smoker  Substance Use Topics  . Alcohol use: Yes    Comment: socially  . Drug use: No     Allergies   Penicillins   Review of Systems Review of Systems  All other systems reviewed and are negative.    Physical Exam Updated Vital Signs BP (!) 150/88 (BP  Location: Left Arm)   Pulse 72   Temp 97.8 F (36.6 C) (Oral)   Resp 14   SpO2 100%   Physical Exam Vitals signs and nursing note reviewed.  Constitutional:      Appearance: He is well-developed.  HENT:     Head: Normocephalic and atraumatic.  Eyes:     General: No scleral icterus.       Right eye: No discharge.        Left eye: No discharge.     Conjunctiva/sclera: Conjunctivae normal.     Pupils: Pupils are equal, round, and reactive to light.  Neck:     Musculoskeletal: Normal range of motion and neck supple.     Vascular: No JVD.  Cardiovascular:     Rate and Rhythm: Normal rate and regular rhythm.     Heart sounds: Normal heart sounds. No murmur. No friction rub. No gallop.      Comments: Anterior chest wall tender to palpation inferiorly and bilaterally Pulmonary:     Effort: Pulmonary effort is normal. No respiratory distress.     Breath sounds: Normal breath sounds. No wheezing or rales.     Comments: CTAB Chest:     Chest wall: No tenderness.  Abdominal:     General: There  is no distension.     Palpations: Abdomen is soft. There is no mass.     Tenderness: There is no abdominal tenderness. There is no guarding or rebound.  Musculoskeletal: Normal range of motion.        General: No tenderness.  Skin:    General: Skin is warm and dry.  Neurological:     Mental Status: He is alert and oriented to person, place, and time.  Psychiatric:        Behavior: Behavior normal.        Thought Content: Thought content normal.        Judgment: Judgment normal.      ED Treatments / Results  Labs (all labs ordered are listed, but only abnormal results are displayed) Labs Reviewed  CBC  COMPREHENSIVE METABOLIC PANEL  D-DIMER, QUANTITATIVE (NOT AT [facility])  I-STAT TROPONIN, ED    EKG EKG Interpretation  Date/Time:  Tuesday February 17 2019 04:49:11 EST Ventricular Rate:  70 PR Interval:    QRS Duration: 86 QT Interval:  378 QTC Calculation: 408 R  Axis:   72 Text Interpretation:  Sinus rhythm No significant change since last tracing Confirmed by Pryor Curia [phone]) on 02/17/2019 4:57:24 AM   Radiology No results found.  Procedures Procedures (including critical care time)  Medications Ordered in ED Medications  sodium chloride flush (NS) 0.9 % injection 3 mL (has no administration in time range)  morphine 4 MG/ML injection 4 mg (has no administration in time range)  ondansetron (ZOFRAN) injection 4 mg (has no administration in time range)     Initial Impression / Assessment and Plan / ED Course  I have reviewed the triage vital signs and the nursing notes.  Pertinent labs & imaging results that were available during my care of the patient were reviewed by me and considered in my medical decision making (see chart for details).        Patient with CP and SOB.  Easily reproducible to palpation.  Not thought to be ACS.  He does travel daily about 3-4 hours.  D-dimer is elevated.  Will get CT PE study.   Care signed out to [place], [place], who will continue care.    Plan: Follow-up on CT PE Repeat troponin at 0830  Final Clinical Impressions(s) / ED Diagnoses   Final diagnoses:  None    ED Discharge Orders    None       Montine Circle, PA-C 02/17/19 6378    Ward, Delice Bison, DO 02/17/19 [phone]

## 2019-02-17 NOTE — ED Notes (Signed)
Patient transported to CT 

## 2019-06-28 ENCOUNTER — Encounter (HOSPITAL_COMMUNITY): Payer: Self-pay | Admitting: Emergency Medicine

## 2019-06-28 ENCOUNTER — Other Ambulatory Visit: Payer: Self-pay

## 2019-06-28 DIAGNOSIS — R1030 Lower abdominal pain, unspecified: Secondary | ICD-10-CM | POA: Insufficient documentation

## 2019-06-28 DIAGNOSIS — K5900 Constipation, unspecified: Secondary | ICD-10-CM | POA: Diagnosis present

## 2019-06-28 DIAGNOSIS — Z79899 Other long term (current) drug therapy: Secondary | ICD-10-CM | POA: Insufficient documentation

## 2019-06-28 NOTE — ED Triage Notes (Addendum)
Patient c/o constipation and abdominal pain. States last BM x2 days ago. Reports taking OTC laxatives without relief. Denies vomiting.

## 2019-06-29 ENCOUNTER — Emergency Department (HOSPITAL_COMMUNITY): Payer: PRIVATE HEALTH INSURANCE

## 2019-06-29 ENCOUNTER — Emergency Department (HOSPITAL_COMMUNITY)
Admission: EM | Admit: 2019-06-29 | Discharge: 2019-06-29 | Disposition: A | Discharge status: Home / Self Care | Payer: PRIVATE HEALTH INSURANCE | Attending: Emergency Medicine | Admitting: Emergency Medicine

## 2019-06-29 DIAGNOSIS — K5900 Constipation, unspecified: Secondary | ICD-10-CM

## 2019-06-29 DIAGNOSIS — R1030 Lower abdominal pain, unspecified: Secondary | ICD-10-CM

## 2019-06-29 LAB — CBC WITH DIFFERENTIAL/PLATELET
Abs Immature Granulocytes: 0.01 10*3/uL (ref 0.00–0.07)
Basophils Absolute: 0 10*3/uL (ref 0.0–0.1)
Basophils Relative: 1 %
Eosinophils Absolute: 0.1 10*3/uL (ref 0.0–0.5)
Eosinophils Relative: 2 %
HCT: 40.5 % (ref 39.0–52.0)
Hemoglobin: 12.2 g/dL — ABNORMAL LOW (ref 13.0–17.0)
Immature Granulocytes: 0 %
Lymphocytes Relative: 36 %
Lymphs Abs: 2.3 10*3/uL (ref 0.7–4.0)
MCH: 25.1 pg — ABNORMAL LOW (ref 26.0–34.0)
MCHC: 30.1 g/dL (ref 30.0–36.0)
MCV: 83.2 fL (ref 80.0–100.0)
Monocytes Absolute: 0.6 10*3/uL (ref 0.1–1.0)
Monocytes Relative: 9 %
Neutro Abs: 3.4 10*3/uL (ref 1.7–7.7)
Neutrophils Relative %: 52 %
Platelets: 235 10*3/uL (ref 150–400)
RBC: 4.87 MIL/uL (ref 4.22–5.81)
RDW: 16.6 % — ABNORMAL HIGH (ref 11.5–15.5)
WBC: 6.4 10*3/uL (ref 4.0–10.5)
nRBC: 0 % (ref 0.0–0.2)

## 2019-06-29 LAB — BASIC METABOLIC PANEL
Anion gap: 8 (ref 5–15)
BUN: 15 mg/dL (ref 6–20)
CO2: 25 mmol/L (ref 22–32)
Calcium: 8.9 mg/dL (ref 8.9–10.3)
Chloride: 108 mmol/L (ref 98–111)
Creatinine, Ser: 1.09 mg/dL (ref 0.61–1.24)
GFR calc Af Amer: >60 mL/min (ref >60)
GFR calc non Af Amer: >60 mL/min (ref >60)
Glucose, Bld: 105 mg/dL — ABNORMAL HIGH (ref 70–99)
Potassium: 3.8 mmol/L (ref 3.5–5.1)
Sodium: 141 mmol/L (ref 135–145)

## 2019-06-29 MED ORDER — LACTULOSE 10 GM/15ML PO SOLN
10.0000 g | Freq: Once | ORAL | Status: AC
  Administered 2019-06-29: 10 g via ORAL
  Filled 2019-06-29: qty 30

## 2019-06-29 MED ORDER — POLYETHYLENE GLYCOL 3350 17 G PO PACK: 17.0000 g | PACK | Freq: Every day | ORAL | 0 refills | Status: DC

## 2019-06-29 MED ORDER — BISACODYL 10 MG RE SUPP
10.0000 mg | Freq: Once | RECTAL | Status: AC
  Administered 2019-06-29: 10 mg via RECTAL
  Filled 2019-06-29: qty 1

## 2019-06-29 NOTE — ED Provider Notes (Signed)
[facility] DEPT Provider Note   CSN: [id number] Arrival date & time: 06/28/19  2125    History   Chief Complaint Chief Complaint  Patient presents with  . Constipation    HPI Mike Reyes is a 48 y.o. male with a hx of GERD, mild anemia presents to the Emergency Department complaining of gradual, persistent, progressively worsening constipation.  Pt reports 3 days without a bowel movement.  He reports this afternoon around 3 PM he took 4 squares of Ex-Lax and 1 stool softener without relief.  He reports lower abdominal cramping when attempting to have a bowel movement but no pain otherwise.  Patient denies rectal pain, testicular penile pain.  No dysuria.  Patient reports he normally has a bowel movement every other day.  No changes in diet or medication.  He denies fever, chills, headache, neck pain, chest pain, shortness of breath, nausea, vomiting, diarrhea, weakness, dizziness, syncope, dysuria, hematuria, melena, hematochezia.  Nothing seems to make the symptoms better or worse.  Patient denies a history of abdominal surgeries.     The history is provided by the patient and medical records. No language interpreter was used.  Constipation Associated symptoms: abdominal pain   Associated symptoms: no back pain, no diarrhea, no dysuria, no fever, no nausea and no vomiting     Past Medical History:  Diagnosis Date  . GERD (gastroesophageal reflux disease)     There are no active problems to display for this patient.   Past Surgical History:  Procedure Laterality Date  . KNEE SURGERY          Home Medications    Prior to Admission medications   Medication Sig Start Date End Date Taking? Authorizing Provider  Bisacodyl (LAXATIVE PO) Take 2 tablets by mouth every 8 (eight) hours as needed (constipation).   Yes [provider]  methocarbamol (ROBAXIN) 500 MG tablet Take 500 mg by mouth 2 (two) times daily as needed for muscle spasms.   06/04/19  Yes [provider]  pantoprazole (PROTONIX) 40 MG tablet Take 40 mg by mouth daily. 06/04/19  Yes [provider]  benzonatate (TESSALON) 100 MG capsule Take 2 capsules (200 mg total) by mouth 2 (two) times daily as needed for cough. Patient not taking: Reported on 06/29/2019 02/17/19   Montine Circle, PA-C  polyethylene glycol (MIRALAX / GLYCOLAX) 17 g packet Take 17 g by mouth daily. 06/29/19   Jossilyn Benda, Jarrett Soho, PA-C    Family History No family history on file.  Social History Social History   Tobacco Use  . Smoking status: Never Smoker  Substance Use Topics  . Alcohol use: Yes    Comment: socially  . Drug use: No     Allergies   Penicillins   Review of Systems Review of Systems  Constitutional: Negative for appetite change, diaphoresis, fatigue, fever and unexpected weight change.  HENT: Negative for mouth sores.   Eyes: Negative for visual disturbance.  Respiratory: Negative for cough, chest tightness, shortness of breath and wheezing.   Cardiovascular: Negative for chest pain.  Gastrointestinal: Positive for abdominal pain and constipation. Negative for abdominal distention, diarrhea, nausea and vomiting.  Endocrine: Negative for polydipsia, polyphagia and polyuria.  Genitourinary: Negative for dysuria, frequency, hematuria and urgency.  Musculoskeletal: Negative for back pain and neck stiffness.  Skin: Negative for rash.  Allergic/Immunologic: Negative for immunocompromised state.  Neurological: Negative for syncope, light-headedness and headaches.  Hematological: Does not bruise/bleed easily.  Psychiatric/Behavioral: Negative for sleep disturbance. The patient  is not nervous/anxious.      Physical Exam Updated Vital Signs BP 115/71   Pulse 70   Temp 98.3 F (36.8 C) (Oral)   Resp 18   SpO2 99%   Physical Exam Vitals signs and nursing note reviewed.  Constitutional:      General: He is not in acute distress.    Appearance: He  is not diaphoretic.  HENT:     Head: Normocephalic.  Eyes:     General: No scleral icterus.    Conjunctiva/sclera: Conjunctivae normal.  Neck:     Musculoskeletal: Normal range of motion.  Cardiovascular:     Rate and Rhythm: Normal rate and regular rhythm.     Pulses: Normal pulses.          Radial pulses are 2+ on the right side and 2+ on the left side.  Pulmonary:     Effort: No tachypnea, accessory muscle usage, prolonged expiration, respiratory distress or retractions.     Breath sounds: No stridor.     Comments: Equal chest rise. No increased work of breathing. Abdominal:     General: There is no distension.     Palpations: Abdomen is soft.     Tenderness: There is no abdominal tenderness. There is no guarding or rebound.  Musculoskeletal:     Comments: Moves all extremities equally and without difficulty.  Skin:    General: Skin is warm and dry.     Capillary Refill: Capillary refill takes less than 2 seconds.  Neurological:     Mental Status: He is alert.     GCS: GCS eye subscore is 4. GCS verbal subscore is 5. GCS motor subscore is 6.     Comments: Speech is clear and goal oriented.  Psychiatric:        Mood and Affect: Mood normal.      ED Treatments / Results  Labs (all labs ordered are listed, but only abnormal results are displayed) Labs Reviewed  CBC WITH DIFFERENTIAL/PLATELET - Abnormal; Notable for the following components:      Result Value   Hemoglobin 12.2 (*)    MCH 25.1 (*)    RDW 16.6 (*)    All other components within normal limits  BASIC METABOLIC PANEL - Abnormal; Notable for the following components:   Glucose, Bld 105 (*)    All other components within normal limits    Radiology Dg Abd Acute 2+v W 1v Chest  Result Date: 06/29/2019 CLINICAL DATA:  Constipation and lower abdominal pain. EXAM: DG ABDOMEN ACUTE W/ 1V CHEST COMPARISON:  Chest radiograph and CT 02/17/2019 FINDINGS: The cardiomediastinal contours are normal. The lungs are  clear. There is no free intra-abdominal air. No dilated bowel loops to suggest obstruction. Moderate stool in the ascending and transverse colon. Redundant sigmoid colon with moderate stool. No abnormal rectal distention. Small right upper quadrant calcification. Multiple pelvic phleboliths. No acute osseous abnormalities are seen. IMPRESSION: 1. Moderate stool burden with sigmoid colonic redundancy. No bowel obstruction. 2. Clear lungs. Electronically Signed   By: Keith Rake M.D.   On: 06/29/2019 01:51    Procedures Procedures (including critical care time)  Medications Ordered in ED Medications  bisacodyl (DULCOLAX) suppository 10 mg (10 mg Rectal Given 06/29/19 0244)  lactulose (CHRONULAC) 10 GM/15ML solution 10 g (10 g Oral Given 06/29/19 0244)     Initial Impression / Assessment and Plan / ED Course  I have reviewed the triage vital signs and the nursing notes.  Pertinent labs &  imaging results that were available during my care of the patient were reviewed by me and considered in my medical decision making (see chart for details).  Clinical Course as of Jun 28 346  Mon Jun 29, 2019  0342 Pt reports BM here in the ED and is feeling much better. ABd pain has resolved completely.    [HM]  0932 baseline  Hemoglobin(!): 12.2 [HM]    Clinical Course User Index [HM] Bhavya Grand, Gwenlyn Perking       Patient presents with lower abdominal pain and complaints of constipation.  Labs are reassuring.  Mild anemia is baseline.  Plain films of his chest and abdomen show moderate stool burden, but no evidence of bowel obstruction.  Patient given laxative in the emergency department producing bowel movement.  Patient reports his abdominal pain has resolved and he feels much better.  Will discharge to home.  Patient will have close primary care follow-up.  Discussed reasons to return immediately to the emergency department.  Patient states understanding and is in agreement with the plan.   Final Clinical Impressions(s) / ED Diagnoses   Final diagnoses:  Constipation, unspecified constipation type  Lower abdominal pain    ED Discharge Orders         Ordered    polyethylene glycol (MIRALAX / GLYCOLAX) 17 g packet  Daily     06/29/19 0345           Myrtie Leuthold, Gwenlyn Perking 35/57/32 2025    Delora Fuel, MD 42/70/62 [phone]

## 2019-06-29 NOTE — Discharge Instructions (Addendum)
1. Medications: miralax, usual home medications °2. Treatment: rest, drink plenty of fluids, advance diet slowly °3. Follow Up: Please followup with your primary doctor in 2 days for discussion of your diagnoses and further evaluation after today's visit; if you do not have a primary care doctor use the resource guide provided to find one; Please return to the ER for persistent vomiting, high fevers or worsening symptoms ° °

## 2019-10-10 ENCOUNTER — Emergency Department (HOSPITAL_COMMUNITY): Payer: PRIVATE HEALTH INSURANCE

## 2019-10-10 ENCOUNTER — Other Ambulatory Visit: Payer: Self-pay

## 2019-10-10 ENCOUNTER — Emergency Department (HOSPITAL_COMMUNITY)
Admission: EM | Admit: 2019-10-10 | Discharge: 2019-10-10 | Disposition: A | Discharge status: Home / Self Care | Payer: PRIVATE HEALTH INSURANCE | Attending: Emergency Medicine | Admitting: Emergency Medicine

## 2019-10-10 DIAGNOSIS — M791 Myalgia, unspecified site: Secondary | ICD-10-CM | POA: Insufficient documentation

## 2019-10-10 DIAGNOSIS — R0789 Other chest pain: Secondary | ICD-10-CM | POA: Diagnosis not present

## 2019-10-10 DIAGNOSIS — Z20828 Contact with and (suspected) exposure to other viral communicable diseases: Secondary | ICD-10-CM | POA: Diagnosis not present

## 2019-10-10 DIAGNOSIS — R0781 Pleurodynia: Secondary | ICD-10-CM

## 2019-10-10 DIAGNOSIS — R0602 Shortness of breath: Secondary | ICD-10-CM | POA: Diagnosis present

## 2019-10-10 DIAGNOSIS — Z20822 Contact with and (suspected) exposure to covid-19: Secondary | ICD-10-CM

## 2019-10-10 LAB — HEPATIC FUNCTION PANEL
ALT: 13 U/L (ref 0–44)
AST: 33 U/L (ref 15–41)
Albumin: 3.9 g/dL (ref 3.5–5.0)
Alkaline Phosphatase: 95 U/L (ref 38–126)
Bilirubin, Direct: 0.1 mg/dL (ref 0.0–0.2)
Indirect Bilirubin: 0.4 mg/dL (ref 0.3–0.9)
Total Bilirubin: 0.5 mg/dL (ref 0.3–1.2)
Total Protein: 7.3 g/dL (ref 6.5–8.1)

## 2019-10-10 LAB — CBC
HCT: 41.4 % (ref 39.0–52.0)
Hemoglobin: 12.9 g/dL — ABNORMAL LOW (ref 13.0–17.0)
MCH: 25.9 pg — ABNORMAL LOW (ref 26.0–34.0)
MCHC: 31.2 g/dL (ref 30.0–36.0)
MCV: 83.1 fL (ref 80.0–100.0)
Platelets: 250 10*3/uL (ref 150–400)
RBC: 4.98 MIL/uL (ref 4.22–5.81)
RDW: 15.8 % — ABNORMAL HIGH (ref 11.5–15.5)
WBC: 7.2 10*3/uL (ref 4.0–10.5)
nRBC: 0 % (ref 0.0–0.2)

## 2019-10-10 LAB — BASIC METABOLIC PANEL
Anion gap: 9 (ref 5–15)
BUN: 13 mg/dL (ref 6–20)
CO2: 26 mmol/L (ref 22–32)
Calcium: 8.8 mg/dL — ABNORMAL LOW (ref 8.9–10.3)
Chloride: 101 mmol/L (ref 98–111)
Creatinine, Ser: 1.35 mg/dL — ABNORMAL HIGH (ref 0.61–1.24)
GFR calc Af Amer: >60 mL/min (ref >60)
GFR calc non Af Amer: >60 mL/min (ref >60)
Glucose, Bld: 127 mg/dL — ABNORMAL HIGH (ref 70–99)
Potassium: 3.9 mmol/L (ref 3.5–5.1)
Sodium: 136 mmol/L (ref 135–145)

## 2019-10-10 LAB — TROPONIN I (HIGH SENSITIVITY)
Troponin I (High Sensitivity): 9 ng/L (ref <18)
Troponin I (High Sensitivity): 8 ng/L (ref <18)

## 2019-10-10 LAB — LIPASE, BLOOD: Lipase: 29 U/L (ref 11–51)

## 2019-10-10 LAB — SARS CORONAVIRUS 2 (TAT 6-24 HRS): SARS Coronavirus 2: NEGATIVE

## 2019-10-10 MED ORDER — SODIUM CHLORIDE 0.9 % IV BOLUS
1000.0000 mL | Freq: Once | INTRAVENOUS | Status: AC
  Administered 2019-10-10: 1000 mL via INTRAVENOUS

## 2019-10-10 MED ORDER — SODIUM CHLORIDE 0.9% FLUSH: 3.0000 mL | Freq: Once | INTRAVENOUS | Status: DC

## 2019-10-10 MED ORDER — HYDROCODONE-ACETAMINOPHEN 5-325 MG PO TABS: 1.0000 | ORAL_TABLET | ORAL | 0 refills | Status: DC | PRN

## 2019-10-10 MED ORDER — MORPHINE SULFATE (PF) 4 MG/ML IV SOLN
4.0000 mg | Freq: Once | INTRAVENOUS | Status: AC
  Administered 2019-10-10: 4 mg via INTRAVENOUS
  Filled 2019-10-10: qty 1

## 2019-10-10 MED ORDER — ONDANSETRON HCL 4 MG/2ML IJ SOLN
4.0000 mg | Freq: Once | INTRAMUSCULAR | Status: AC
  Administered 2019-10-10: 4 mg via INTRAVENOUS
  Filled 2019-10-10: qty 2

## 2019-10-10 NOTE — ED Provider Notes (Signed)
[facility] Provider Note   CSN: [id number] Arrival date & time: 10/10/19  [id number]     History   Chief Complaint No chief complaint on file.   HPI Mike Reyes is a 48 y.o. male.     Pt presents to the ED today with sob, bilateral rib pain, and aches all over.  He denies any known covid exposures.  He said he woke up this morning with these sx.     Past Medical History:  Diagnosis Date  . GERD (gastroesophageal reflux disease)     There are no active problems to display for this patient.   Past Surgical History:  Procedure Laterality Date  . KNEE SURGERY          Home Medications    Prior to Admission medications   Medication Sig Start Date End Date Taking? Authorizing Provider  benzonatate (TESSALON) 100 MG capsule Take 2 capsules (200 mg total) by mouth 2 (two) times daily as needed for cough. Patient not taking: Reported on 06/29/2019 02/17/19   Montine Circle, PA-C  Bisacodyl (LAXATIVE PO) Take 2 tablets by mouth every 8 (eight) hours as needed (constipation).    [provider]  HYDROcodone-acetaminophen (NORCO/VICODIN) 5-325 MG tablet Take 1 tablet by mouth every 4 (four) hours as needed. 10/10/19   Isla Pence, MD  methocarbamol (ROBAXIN) 500 MG tablet Take 500 mg by mouth 2 (two) times daily as needed for muscle spasms.  06/04/19   [provider]  pantoprazole (PROTONIX) 40 MG tablet Take 40 mg by mouth daily. 06/04/19   [provider]  polyethylene glycol (MIRALAX / GLYCOLAX) 17 g packet Take 17 g by mouth daily. 06/29/19   Muthersbaugh, Jarrett Soho, PA-C    Family History No family history on file.  Social History Social History   Tobacco Use  . Smoking status: Never Smoker  Substance Use Topics  . Alcohol use: Yes    Comment: socially  . Drug use: No     Allergies   Penicillins   Review of Systems Review of Systems  Constitutional: Positive for fatigue.  Respiratory:  Positive for shortness of breath.   Cardiovascular:       Rib pain  Musculoskeletal: Positive for myalgias.  All other systems reviewed and are negative.    Physical Exam Updated Vital Signs BP 138/79   Pulse 71   Temp 98.6 F (37 C) (Oral)   Resp (!) 22   SpO2 98%   Physical Exam Vitals signs and nursing note reviewed.  Constitutional:      Appearance: Normal appearance.  HENT:     Head: Normocephalic and atraumatic.     Right Ear: External ear normal.     Left Ear: External ear normal.     Nose: Nose normal.     Mouth/Throat:     Mouth: Mucous membranes are moist.     Pharynx: Oropharynx is clear.  Eyes:     Extraocular Movements: Extraocular movements intact.     Conjunctiva/sclera: Conjunctivae normal.     Pupils: Pupils are equal, round, and reactive to light.  Neck:     Musculoskeletal: Normal range of motion and neck supple.  Cardiovascular:     Rate and Rhythm: Normal rate and regular rhythm.     Pulses: Normal pulses.     Heart sounds: Normal heart sounds.  Pulmonary:     Effort: Pulmonary effort is normal.     Breath sounds: Normal breath sounds.  Abdominal:  General: Abdomen is flat. Bowel sounds are normal.     Palpations: Abdomen is soft.  Musculoskeletal: Normal range of motion.  Skin:    General: Skin is warm.     Capillary Refill: Capillary refill takes less than 2 seconds.  Neurological:     General: No focal deficit present.     Mental Status: He is alert and oriented to person, place, and time.  Psychiatric:        Mood and Affect: Mood normal.        Behavior: Behavior normal.        Thought Content: Thought content normal.        Judgment: Judgment normal.      ED Treatments / Results  Labs (all labs ordered are listed, but only abnormal results are displayed) Labs Reviewed  BASIC METABOLIC PANEL - Abnormal; Notable for the following components:      Result Value   Glucose, Bld 127 (*)    Creatinine, Ser 1.35 (*)    Calcium  8.8 (*)    All other components within normal limits  CBC - Abnormal; Notable for the following components:   Hemoglobin 12.9 (*)    MCH 25.9 (*)    RDW 15.8 (*)    All other components within normal limits  SARS CORONAVIRUS 2 (TAT 6-24 HRS)  LIPASE, BLOOD  HEPATIC FUNCTION PANEL  URINALYSIS, ROUTINE W REFLEX MICROSCOPIC  TROPONIN I (HIGH SENSITIVITY)  TROPONIN I (HIGH SENSITIVITY)    EKG EKG Interpretation  Date/Time:  Saturday October 10 2019 07:06:32 EDT Ventricular Rate:  68 PR Interval:  158 QRS Duration: 82 QT Interval:  392 QTC Calculation: 416 R Axis:   68 Text Interpretation:  Normal sinus rhythm Normal ECG No significant change since last tracing Confirmed by Isla Pence [phone]) on 10/10/2019 9:14:04 AM   Radiology Dg Chest 2 View  Result Date: 10/10/2019 CLINICAL DATA:  Chest/rib pain EXAM: CHEST - 2 VIEW COMPARISON:  CTA chest dated 02/17/2019 FINDINGS: Lungs are clear. No pleural effusion or pneumothorax. The heart is normal in size. Visualized osseous structures are within normal limits. IMPRESSION: Normal chest radiographs. Electronically Signed   By: Julian Hy M.D.   On: 10/10/2019 08:06    Procedures Procedures (including critical care time)  Medications Ordered in ED Medications  sodium chloride flush (NS) 0.9 % injection 3 mL (3 mLs Intravenous Not Given 10/10/19 0832)  sodium chloride 0.9 % bolus 1,000 mL (1,000 mLs Intravenous New Bag/Given 10/10/19 0831)  morphine 4 MG/ML injection 4 mg (4 mg Intravenous Given 10/10/19 0830)  ondansetron (ZOFRAN) injection 4 mg (4 mg Intravenous Given 10/10/19 0831)  morphine 4 MG/ML injection 4 mg (4 mg Intravenous Given 10/10/19 0959)     Initial Impression / Assessment and Plan / ED Course  I have reviewed the triage vital signs and the nursing notes.  Pertinent labs & imaging results that were available during my care of the patient were reviewed by me and considered in my medical decision  making (see chart for details).     Pt is feeling better, but still has some rib pain.  He does not have pna and is oxygenating well.  I am concerned he may have covid.  The covid swab is pending.  Pt is told to self isolate until the test comes back.  He knows to return if worse.  Mike Reyes was evaluated in Emergency Department on 10/10/2019 for the symptoms described in the history of present illness. He  was evaluated in the context of the global COVID-19 pandemic, which necessitated consideration that the patient might be at risk for infection with the SARS-CoV-2 virus that causes COVID-19. Institutional protocols and algorithms that pertain to the evaluation of patients at risk for COVID-19 are in a state of rapid change based on information released by regulatory bodies including the CDC and federal and state organizations. These policies and algorithms were followed during the patient's care in the ED.  Final Clinical Impressions(s) / ED Diagnoses   Final diagnoses:  Suspected COVID-19 virus infection  Rib pain    ED Discharge Orders         Ordered    HYDROcodone-acetaminophen (NORCO/VICODIN) 5-325 MG tablet  Every 4 hours PRN     10/10/19 [id number]           Isla Pence, MD 10/10/19 1005

## 2019-10-10 NOTE — ED Notes (Signed)
Pt going to sleep off the meds in [address].

## 2019-10-10 NOTE — ED Notes (Signed)
Pt given dc instructions pt verbalizes understanding. Pt told to call someone to pick him up bc he was just given a medication that he cannot drive with for [id number].

## 2019-10-10 NOTE — ED Triage Notes (Signed)
Pt here with c/o some chest pain along with n/v and rib pain , pt was prescribed Zofran and flexrel from his primary care

## 2019-11-02 ENCOUNTER — Encounter: Payer: Self-pay | Admitting: Internal Medicine

## 2019-11-18 ENCOUNTER — Encounter: Payer: Self-pay | Admitting: *Deleted

## 2019-11-26 ENCOUNTER — Encounter: Payer: Self-pay | Admitting: Internal Medicine

## 2019-11-26 ENCOUNTER — Other Ambulatory Visit: Payer: Self-pay

## 2019-11-26 ENCOUNTER — Ambulatory Visit (INDEPENDENT_AMBULATORY_CARE_PROVIDER_SITE_OTHER): Payer: PRIVATE HEALTH INSURANCE | Admitting: Internal Medicine

## 2019-11-26 VITALS — BP 128/82 | HR 76 | Temp 98.3°F | Ht >= 80 in | Wt 354.0 lb

## 2019-11-26 DIAGNOSIS — R112 Nausea with vomiting, unspecified: Secondary | ICD-10-CM

## 2019-11-26 DIAGNOSIS — Z1211 Encounter for screening for malignant neoplasm of colon: Secondary | ICD-10-CM

## 2019-11-26 DIAGNOSIS — R1010 Upper abdominal pain, unspecified: Secondary | ICD-10-CM | POA: Diagnosis not present

## 2019-11-26 MED ORDER — SUPREP BOWEL PREP KIT 17.5-3.13-1.6 GM/177ML PO SOLN: 1.0000 | ORAL | 0 refills | Status: DC

## 2019-11-26 MED ORDER — ONDANSETRON 4 MG PO TBDP: 4.0000 mg | ORAL_TABLET | Freq: Three times a day (TID) | ORAL | 1 refills | Status: DC | PRN

## 2019-11-26 MED ORDER — PANTOPRAZOLE SODIUM 40 MG PO TBEC: 40.0000 mg | DELAYED_RELEASE_TABLET | Freq: Two times a day (BID) | ORAL | 2 refills | Status: AC

## 2019-11-26 MED ORDER — DICYCLOMINE HCL 20 MG PO TABS: 20.0000 mg | ORAL_TABLET | Freq: Three times a day (TID) | ORAL | 2 refills | Status: DC | PRN

## 2019-11-26 NOTE — Patient Instructions (Addendum)
You have been scheduled for an endoscopy and colonoscopy. Please follow the written instructions given to you at your visit today. Please pick up your prep supplies at the pharmacy within the next 1-3 days. If you use inhalers (even only as needed), please bring them with you on the day of your procedure. Your physician has requested that you go to www.startemmi.com and enter the access code given to you at your visit today. This web site gives a general overview about your procedure. However, you should still follow specific instructions given to you by our office regarding your preparation for the procedure.  We have sent the following medications to your pharmacy for you to pick up at your convenience: Pantoprazole 40 mg twice daily Zofran ODT as needed Bentyl 20 mg three times daily as needed  If you are age 39 or older, your body mass index should be between 23-30. Your Body mass index is 38.89 kg/m. If this is out of the aforementioned range listed, please consider follow up with your Primary Care Provider.  If you are age 55 or younger, your body mass index should be between 19-25. Your Body mass index is 38.89 kg/m. If this is out of the aformentioned range listed, please consider follow up with your Primary Care Provider.

## 2019-11-26 NOTE — Progress Notes (Signed)
Patient ID: Mike Reyes, male   DOB: 11/23/1971, 48 y.o.   MRN: [id number] HPI: Mike Reyes is a 48 year old male with a history of GERD, prediabetes who is seen in consult at the request of Dr. Jeanie Cooks to evaluate upper abdominal pain and reflux.  He is here alone today.  He reports that he has been having on and off episodic epigastric abdominal pain associated with nausea and vomiting.  This has seemingly been worse over the last 4 to 6 weeks and has led to several visits to the emergency department.  He was taking pantoprazole 40 mg which she had been taking for many years but a few weeks ago the dose was doubled to try to help with the symptoms.  He is now taking 80 mg each morning.  It has been helpful but he has still had some minor episodes of upper abdominal pain, nausea and vomiting.  He reports that for the most part his appetite has been good but he has had over a 20 pound weight loss this year.  When symptoms are present it is difficult to sleep and get comfortable as it seems to hurt if he lies on his left or right side.  He does despite the pantoprazole have intermittent heartburn.  He denies dysphagia.  Bowel movements for him have not changed significantly and usually occur about every 3 days.  He was diagnosed with anal fissure and internal hemorrhoids after having seen red blood in his stool and with wiping.  He did have a bout of constipation which occurred in the summer 2020.  X-ray confirmed sigmoid redundancy and suggested constipation.  This was treated with laxatives at the time and he has returned to his more normal bowel pattern.  He does occasionally see red blood with wiping.  He has been using ondansetron with success for the nausea.  Past Medical History:  Diagnosis Date  . Chest wall pain   . GERD (gastroesophageal reflux disease)   . Left elbow tendinitis   . Prediabetes   . Right sided sciatica   . Vitiligo     Past Surgical History:  Procedure Laterality Date  .  KNEE SURGERY      Outpatient Medications Prior to Visit  Medication Sig Dispense Refill  . benzonatate (TESSALON) 100 MG capsule Take 2 capsules (200 mg total) by mouth 2 (two) times daily as needed for cough. 20 capsule 0  . diclofenac (VOLTAREN) 75 MG EC tablet TK 1 T PO BID WF PRN    . HYDROcodone-acetaminophen (NORCO/VICODIN) 5-325 MG tablet Take 1 tablet by mouth every 4 (four) hours as needed. 10 tablet 0  . methocarbamol (ROBAXIN) 500 MG tablet Take 500 mg by mouth 2 (two) times daily as needed for muscle spasms.     . traMADol (ULTRAM) 50 MG tablet Take 50 mg by mouth every 6 (six) hours as needed.    . ondansetron (ZOFRAN-ODT) 4 MG disintegrating tablet DIS 1 T ON THE TONGUE Q 8 H PRF NAUSEA OR VOM    . pantoprazole (PROTONIX) 40 MG tablet Take 80 mg by mouth daily.     . traMADol (ULTRAM-ER) 100 MG 24 hr tablet Take 100 mg by mouth daily.    . Bisacodyl (LAXATIVE PO) Take 2 tablets by mouth every 8 (eight) hours as needed (constipation).    . polyethylene glycol (MIRALAX / GLYCOLAX) 17 g packet Take 17 g by mouth daily. 14 each 0   No facility-administered medications prior to visit.  Allergies  Allergen Reactions  . Gabapentin Hives and Itching  . Penicillins Nausea And Vomiting    Did it involve swelling of the face/tongue/throat, SOB, or low BP? No Did it involve sudden or severe rash/hives, skin peeling, or any reaction on the inside of your mouth or nose? No Did you need to seek medical attention at a hospital or doctor's office? No When did it last happen?unknown  If all above answers are "NO", may proceed with cephalosporin use.      Family History  Problem Relation Age of Onset  . Hypertension Mother   . Colon cancer Neg Hx   . Esophageal cancer Neg Hx   . Stomach cancer Neg Hx     Social History   Tobacco Use  . Smoking status: Never Smoker  . Smokeless tobacco: Never Used  Substance Use Topics  . Alcohol use: Yes    Comment: socially  . Drug  use: No    ROS: As per history of present illness, otherwise negative  BP 128/82   Pulse 76   Temp 98.3 F (36.8 C)   Ht 6\' 8"  (2.032 m)   Wt (!) 354 lb (160.6 kg)   BMI 38.89 kg/m  Constitutional: Well-developed and well-nourished. No distress. HEENT: Normocephalic and atraumatic. Conjunctivae are normal.  No scleral icterus. Neck: Neck supple. Trachea midline. Cardiovascular: Normal rate, regular rhythm and intact distal pulses. No M/R/G Pulmonary/chest: Effort normal and breath sounds normal. No wheezing, rales or rhonchi. Abdominal: Soft, nontender, nondistended. Bowel sounds active throughout. There are no masses palpable. No hepatosplenomegaly. Extremities: no clubbing, cyanosis, or edema Neurological: Alert and oriented to person place and time. Skin: Skin is warm and dry.  Psychiatric: Normal mood and affect. Behavior is normal.  RELEVANT LABS AND IMAGING: CBC    Component Value Date/Time   WBC 7.2 10/10/2019 0716   RBC 4.98 10/10/2019 0716   HGB 12.9 (L) 10/10/2019 0716   HCT 41.4 10/10/2019 0716   PLT 250 10/10/2019 0716   MCV 83.1 10/10/2019 0716   MCH 25.9 (L) 10/10/2019 0716   MCHC 31.2 10/10/2019 0716   RDW 15.8 (H) 10/10/2019 0716   LYMPHSABS 2.3 06/29/2019 0243   MONOABS 0.6 06/29/2019 0243   EOSABS 0.1 06/29/2019 0243   BASOSABS 0.0 06/29/2019 0243    CMP     Component Value Date/Time   NA 136 10/10/2019 0716   K 3.9 10/10/2019 0716   CL 101 10/10/2019 0716   CO2 26 10/10/2019 0716   GLUCOSE 127 (H) 10/10/2019 0716   BUN 13 10/10/2019 0716   CREATININE 1.35 (H) 10/10/2019 0716   CALCIUM 8.8 (L) 10/10/2019 0716   PROT 7.3 10/10/2019 0716   ALBUMIN 3.9 10/10/2019 0716   AST 33 10/10/2019 0716   ALT 13 10/10/2019 0716   ALKPHOS 95 10/10/2019 0716   BILITOT 0.5 10/10/2019 0716   GFRNONAA >60 10/10/2019 0716   GFRAA >60 10/10/2019 0716    ASSESSMENT/PLAN: 48 year old male with a history of GERD, prediabetes who is seen in consult at the  request of Dr. Jeanie Cooks to evaluate upper abdominal pain and reflux.   1. Epigastric/upper abdominal pain with nausea and vomiting --his symptoms are episodic and occurring despite PPI therapy.  His PPI dose has been doubled and there may have been some improvement.  I feel it important to rule out ulcer disease, H. pylori.  Also in the differential would be gastroparesis, gallbladder pathology, cyclic vomiting.  I recommended we proceed as follows --Upper endoscopy;  we discussed the risk, benefits and alternatives and he is agreeable and wishes to proceed --Continue pantoprazole but split the dose so that he is taking 40 mg twice daily AC rather than 80 mg in the morning --Continued use Zofran 4 mg ODT every 6-8 hours as needed nausea and vomiting --Add Bentyl 20 mg 3 times daily as needed for crampy abdominal pain and spasm --If evaluation unremarkable may consider abdominal ultrasound versus CT scan and/or gastric emptying scan  2.  Colon cancer screening --48 year old average risk African-American male.  I recommended that we proceed with colonoscopy for colon cancer screening at the same time as his upper endoscopy.  We discussed the risk, benefits and alternatives and he is agreeable and wishes to proceed   Cc:Nolene Ebbs, [address] [place],  [place] [id number]

## 2019-12-14 ENCOUNTER — Ambulatory Visit (INDEPENDENT_AMBULATORY_CARE_PROVIDER_SITE_OTHER): Payer: PRIVATE HEALTH INSURANCE

## 2019-12-14 ENCOUNTER — Other Ambulatory Visit: Payer: Self-pay | Admitting: Internal Medicine

## 2019-12-14 DIAGNOSIS — Z1159 Encounter for screening for other viral diseases: Secondary | ICD-10-CM

## 2019-12-15 LAB — SARS CORONAVIRUS 2 (TAT 6-24 HRS): SARS Coronavirus 2: NEGATIVE

## 2019-12-16 ENCOUNTER — Encounter: Payer: Self-pay | Admitting: Internal Medicine

## 2019-12-16 ENCOUNTER — Other Ambulatory Visit: Payer: Self-pay

## 2019-12-16 ENCOUNTER — Ambulatory Visit (AMBULATORY_SURGERY_CENTER): Payer: PRIVATE HEALTH INSURANCE | Admitting: Internal Medicine

## 2019-12-16 VITALS — BP 118/65 | HR 72 | Temp 98.5°F | Resp 16 | Ht >= 80 in | Wt 354.0 lb

## 2019-12-16 DIAGNOSIS — R112 Nausea with vomiting, unspecified: Secondary | ICD-10-CM

## 2019-12-16 DIAGNOSIS — R1010 Upper abdominal pain, unspecified: Secondary | ICD-10-CM

## 2019-12-16 DIAGNOSIS — Z1211 Encounter for screening for malignant neoplasm of colon: Secondary | ICD-10-CM

## 2019-12-16 DIAGNOSIS — K2100 Gastro-esophageal reflux disease with esophagitis, without bleeding: Secondary | ICD-10-CM | POA: Diagnosis not present

## 2019-12-16 DIAGNOSIS — D123 Benign neoplasm of transverse colon: Secondary | ICD-10-CM

## 2019-12-16 DIAGNOSIS — K3189 Other diseases of stomach and duodenum: Secondary | ICD-10-CM | POA: Diagnosis not present

## 2019-12-16 MED ORDER — SODIUM CHLORIDE 0.9 % IV SOLN: 500.0000 mL | INTRAVENOUS | Status: DC

## 2019-12-16 NOTE — Op Note (Signed)
[facility] Patient Name: [facility] Procedure Date: 12/16/2019 3:16 PM MRN: [id number] Endoscopist: Jerene Bears , MD Age: 48 Referring MD:  Date of Birth: Nov 06, 1971 Gender: Male Account #: [id number] Procedure:                Colonoscopy Indications:              Screening for colorectal malignant neoplasm, This                            is the patient's first colonoscopy Medicines:                Monitored Anesthesia Care Procedure:                Pre-Anesthesia Assessment:                           - Prior to the procedure, a History and Physical                            was performed, and patient medications and                            allergies were reviewed. The patient's tolerance of                            previous anesthesia was also reviewed. The risks                            and benefits of the procedure and the sedation                            options and risks were discussed with the patient.                            All questions were answered, and informed consent                            was obtained. Prior Anticoagulants: The patient has                            taken no previous anticoagulant or antiplatelet                            agents. ASA Grade Assessment: II - A patient with                            mild systemic disease. After reviewing the risks                            and benefits, the patient was deemed in                            satisfactory condition to undergo the procedure.  After obtaining informed consent, the colonoscope                            was passed under direct vision. Throughout the                            procedure, the patient's blood pressure, pulse, and                            oxygen saturations were monitored continuously. The                            Colonoscope was introduced through the anus and                            advanced to the cecum,  identified by appendiceal                            orifice and ileocecal valve. The colonoscopy was                            performed without difficulty. The patient tolerated                            the procedure well. The quality of the bowel                            preparation was good. The ileocecal valve,                            appendiceal orifice, and rectum were photographed. Scope In: 3:27:42 PM Scope Out: 3:39:19 PM Scope Withdrawal Time: 0 hours 9 minutes 47 seconds  Total Procedure Duration: 0 hours 11 minutes 37 seconds  Findings:                 The digital rectal exam was normal.                           A 5 mm polyp was found in the transverse colon. The                            polyp was sessile. The polyp was removed with a                            cold snare. Resection and retrieval were complete.                           Internal hemorrhoids were found during                            retroflexion. The hemorrhoids were medium-sized.                           The exam was otherwise without abnormality. Complications:  No immediate complications. Estimated Blood Loss:     Estimated blood loss was minimal. Impression:               - One 5 mm polyp in the transverse colon, removed                            with a cold snare. Resected and retrieved.                           - Internal hemorrhoids.                           - The examination was otherwise normal. Recommendation:           - Patient has a contact number available for                            emergencies. The signs and symptoms of potential                            delayed complications were discussed with the                            patient. Return to normal activities tomorrow.                            Written discharge instructions were provided to the                            patient.                           - Resume previous diet.                            - Continue present medications.                           - Await pathology results.                           - Repeat colonoscopy is recommended for                            surveillance. The colonoscopy date will be                            determined after pathology results from today's                            exam become available for review. Jerene Bears, MD 12/16/2019 3:55:23 PM This report has been signed electronically.

## 2019-12-16 NOTE — Progress Notes (Signed)
Windell Norfolk RN IV, DT vitals, LC temp.

## 2019-12-16 NOTE — Progress Notes (Signed)
PT taken to PACU. Monitors in place. VSS. Report given to RN. 

## 2019-12-16 NOTE — Patient Instructions (Signed)
Be sure to take your pantoprazole as directed.  Read all handouts given to you by your recovery room nurse. Thank-you for choosing [place] for your healthcare needs today.  YOU HAD AN ENDOSCOPIC PROCEDURE TODAY AT [facility]:   Refer to the procedure report that was given to you for any specific questions about what was found during the examination.  If the procedure report does not answer your questions, please call your gastroenterologist to clarify.  If you requested that your care partner not be given the details of your procedure findings, then the procedure report has been included in a sealed envelope for you to review at your convenience later.  YOU SHOULD EXPECT: Some feelings of bloating in the abdomen. Passage of more gas than usual.  Walking can help get rid of the air that was put into your GI tract during the procedure and reduce the bloating. If you had a lower endoscopy (such as a colonoscopy or flexible sigmoidoscopy) you may notice spotting of blood in your stool or on the toilet paper. If you underwent a bowel prep for your procedure, you may not have a normal bowel movement for a few days.  Please Note:  You might notice some irritation and congestion in your nose or some drainage.  This is from the oxygen used during your procedure.  There is no need for concern and it should clear up in a day or so.  SYMPTOMS TO REPORT IMMEDIATELY:   Following lower endoscopy (colonoscopy or flexible sigmoidoscopy):  Excessive amounts of blood in the stool  Significant tenderness or worsening of abdominal pains  Swelling of the abdomen that is new, acute  Fever of 100F or higher   Following upper endoscopy (EGD)  Vomiting of blood or coffee ground material  New chest pain or pain under the shoulder blades  Painful or persistently difficult swallowing  New shortness of breath  Fever of 100F or higher  Black, tarry-looking stools  For urgent or emergent issues, a  gastroenterologist can be reached at any hour by calling [phone].   DIET:  We do recommend a small meal at first, but then you may proceed to your regular diet.  Drink plenty of fluids but you should avoid alcoholic beverages for 24 hours.  ACTIVITY:  You should plan to take it easy for the rest of today and you should NOT DRIVE or use heavy machinery until tomorrow (because of the sedation medicines used during the test).    FOLLOW UP: Our staff will call the number listed on your records 48-72 hours following your procedure to check on you and address any questions or concerns that you may have regarding the information given to you following your procedure. If we do not reach you, we will leave a message.  We will attempt to reach you two times.  During this call, we will ask if you have developed any symptoms of COVID 19. If you develop any symptoms (ie: fever, flu-like symptoms, shortness of breath, cough etc.) before then, please call [phone].  If you test positive for Covid 19 in the 2 weeks post procedure, please call and report this information to [place].    If any biopsies were taken you will be contacted by phone or by letter within the next 1-3 weeks.  Please call us at [phone] if you have not heard about the biopsies in 3 weeks.    SIGNATURES/CONFIDENTIALITY: You and/or your care partner have signed paperwork which  will be entered into your electronic medical record.  These signatures attest to the fact that that the information above on your After Visit Summary has been reviewed and is understood.  Full responsibility of the confidentiality of this discharge information lies with you and/or your care-partner.

## 2019-12-16 NOTE — Op Note (Signed)
[facility] Patient Name: Mike Reyes Procedure Date: 12/16/2019 3:16 PM MRN: [id number] Endoscopist: Jerene Bears , MD Age: 48 Referring MD:  Date of Birth: [birth year] Gender: Male Account #: [id number] Procedure:                Upper GI endoscopy Indications:              Epigastric abdominal pain, Nausea with vomiting Medicines:                Monitored Anesthesia Care Procedure:                Pre-Anesthesia Assessment:                           - Prior to the procedure, a History and Physical                            was performed, and patient medications and                            allergies were reviewed. The patient's tolerance of                            previous anesthesia was also reviewed. The risks                            and benefits of the procedure and the sedation                            options and risks were discussed with the patient.                            All questions were answered, and informed consent                            was obtained. Prior Anticoagulants: The patient has                            taken no previous anticoagulant or antiplatelet                            agents. ASA Grade Assessment: II - A patient with                            mild systemic disease. After reviewing the risks                            and benefits, the patient was deemed in                            satisfactory condition to undergo the procedure.                           After obtaining informed consent, the endoscope was  passed under direct vision. Throughout the                            procedure, the patient's blood pressure, pulse, and                            oxygen saturations were monitored continuously. The                            Endoscope was introduced through the mouth, and                            advanced to the second part of duodenum. The upper                            GI endoscopy  was accomplished without difficulty.                            The patient tolerated the procedure well. Scope In: Scope Out: Findings:                 LA Grade A (one or more mucosal breaks less than 5                            mm, not extending between tops of 2 mucosal folds)                            esophagitis was found 40 cm from the incisors.                           A 3 cm hiatal hernia was present.                           The entire examined stomach was normal. Biopsies                            were taken with a cold forceps for histology and                            Helicobacter pylori testing.                           The examined duodenum was normal. Complications:            No immediate complications. Estimated Blood Loss:     Estimated blood loss was minimal. Impression:               - Reflux esophagitis.                           - 3 cm hiatal hernia.                           - Normal stomach. Biopsied.                           -  Normal examined duodenum. Recommendation:           - Patient has a contact number available for                            emergencies. The signs and symptoms of potential                            delayed complications were discussed with the                            patient. Return to normal activities tomorrow.                            Written discharge instructions were provided to the                            patient.                           - Resume previous diet.                           - Continue present medications.                           - Continue the pantoprazole 40 mg twice daily to                            control acid reflux and help esophagitis heal.                           - Await pathology results. Jerene Bears, MD 12/16/2019 3:53:07 PM This report has been signed electronically.

## 2019-12-21 ENCOUNTER — Telehealth: Payer: Self-pay | Admitting: *Deleted

## 2019-12-21 NOTE — Telephone Encounter (Signed)
  Follow up Call-  Call back number 12/16/2019  Post procedure Call Back phone  # [phone]  Permission to leave phone message Yes  Some recent data might be hidden     Patient questions:  Do you have a fever, pain , or abdominal swelling? No. Pain Score  0 *  Have you tolerated food without any problems? Yes.    Have you been able to return to your normal activities? Yes.    Do you have any questions about your discharge instructions: Diet   No. Medications  No. Follow up visit  No.  Do you have questions or concerns about your Care? No.  Actions: * If pain score is 4 or above: No action needed, pain <4.  1. Have you developed a fever since your procedure? no  2.   Have you had an respiratory symptoms (SOB or cough) since your procedure? no  3.   Have you tested positive for COVID 19 since your procedure no  4.   Have you had any family members/close contacts diagnosed with the COVID 19 since your procedure?  no   If yes to any of these questions please route to Joylene John, RN and Alphonsa Gin, Therapist, sports.

## 2019-12-31 ENCOUNTER — Encounter: Payer: Self-pay | Admitting: Internal Medicine

## 2020-05-30 ENCOUNTER — Encounter: Payer: Self-pay | Admitting: Emergency Medicine

## 2020-05-30 ENCOUNTER — Ambulatory Visit (INDEPENDENT_AMBULATORY_CARE_PROVIDER_SITE_OTHER): Payer: Self-pay | Admitting: Emergency Medicine

## 2020-05-30 ENCOUNTER — Other Ambulatory Visit: Payer: Self-pay

## 2020-05-30 VITALS — BP 134/86 | HR 73 | Temp 97.4°F | Ht >= 80 in | Wt 372.0 lb

## 2020-05-30 DIAGNOSIS — Z024 Encounter for examination for driving license: Secondary | ICD-10-CM

## 2020-05-30 NOTE — Patient Instructions (Signed)

## 2020-05-30 NOTE — Progress Notes (Signed)
BP Readings from Last 3 Encounters:  05/30/20 134/86  12/16/19 118/65  11/26/19 128/82       This patient presents for DOT examination for fitness for duty.   Medical History:  1. Head/Brain Injuries, disorders or illnesses no  2. Seizures, epilepsy no  3. Eye disorders or impaired vision (except corrective lenses) no  4. Ear disorders, loss of hearing or balance no  5. Heart disease or heart attack, other cardiovascular condition no  6. Heart surgery (valve replacement/bypass, angioplasty, pacemaker/defribrillator) no  7. High blood pressure no  8. High cholesterol no  9. Chronic cough, shortness of breath or other breathing problems no  10. Lung disease (emphysema, asthma or chronic bronchitis) no  11. Kidney disease, dialysis no  12. Digestive problems  no  13. Diabetes or elevated blood sugar no                      If yes to #13, Insulin use no  14. Nervious or psychiatric disorders, e.g., severe depression no  15. Fainting or syncope no  16. Dizziness, headaches, numbness, tingling or memory loss no  17. Unexplained weight loss no  18. Stroke, TIA or paralysis no  19. Missing or impaired hand, arm, foot, leg, finger, toe no  20. Spinal injury or disease no  21. Bone, muscles or nerve problems no  22. Blood clots or bleeding bleeding disorders no  23. Cancer no  24. Chronic infection or other chronic diseases no  25. Sleep disorders, pauses in breathing while asleep, daytime sleepiness, loud snoring no  26. Have you ever had a sleep test? no  27.  Have you ever spent a night in the hospital? no  28. Have you ever had a broken bone? no  29. Have you or or do you use tobacco products? no  30. Regular, frequent alcohol use no  31. Illegal substance use within the past 2 years no  32.  Have you ever failed a drug test or been dependent on an illegal substance? no   Current Medications: Prior to Admission medications   Medication Sig Start Date End Date Taking?  Authorizing Provider  pantoprazole (PROTONIX) 40 MG tablet Take 1 tablet (40 mg total) by mouth 2 (two) times daily. 11/26/19  Yes Pyrtle, Lajuan Lines, MD    Medical Examiner's Comments on Health History:  In good general medical condition  TESTING:   Hearing Screening   125Hz  250Hz  500Hz  1000Hz  2000Hz  3000Hz  4000Hz  6000Hz  8000Hz   Right ear:           Left ear:           Comments: The patient was able to hear a forced whisper from 9 feet.    Visual Acuity Screening   Right eye Left eye Both eyes  Without correction:     With correction: 20/15 20/15 20/15   Comments: The patient can distinguish the colors red, amber and green. Peripheral Vision: Right eye 70 degrees. Left eye 70 degrees.    Monocular Vision: No.  Hearing Aid used for test: No. Hearing Aid required to to meet standard: No.  BP 134/86   Pulse 73   Temp (!) 97.4 F (36.3 C) (Temporal)   Ht 6\' 8"  (2.032 m)   Wt (!) 372 lb (168.7 kg)   SpO2 97%   BMI 40.87 kg/m  Pulse rate is regular  Comments: SG: 1.020 Blood : NEG  Sugar: NEG Protein: NEG  PHYSICAL EXAMINATION:  General Appearance  Not markedly obese. No tremor, signs of alcoholism, problem drinking or drug abuse.   Skin Warm, dry and intact.   Eyes Pupils are equal, round and reactive to light and accommodation, extraocular movements are intact. No exophthalmos, no nystagmus.  Ears Normal external ears. External canal without occlusion. No scarring of the TM. No perforation of the TM.  Mouth and Throat Clear and moist. No irremedial deformities likely to interfere with breathing or swallowing.  Heart No murmurs, extra sounds, evidence of cardiomegaly. No pacemaker. No implantable defibrillator.  Lungs and Chest (excluding breasts) Normal chest expansion, respiratory rate, breath sounds. No cyanosis.  Abdomen and Viscera No liver enlargement. No splenic enlargement. No masses, bruits, hernias or significant abdominal wall weakness.  Genitourinary  No inguinal  or femoral hernia.  Spine and other musculoskeletal No tenderness, no limitation of motion, no deformities. No evidence of previous surgery.  Extremities No loss or impairment of leg, foot, toe, arm, hand, finger. No perceptible limp, deformities, atrophy, weakness, paralysis, clubbing, edema, hypotonia. Patient has sufficient grasp and prehension to maintain steering wheel grip. Patient has sufficient mobility and strength in the lower limbs to operate pedals properly.  Neurologic Normal equilibrium, coordination, speech pattern. No paresthesia, asymmetry of deep tendon reflexes, sensory or positional abnormalities. No abnormality of patellar or Babinski's reflexes.  Gait Not antalgic or ataxic  Vascular Normal pulses. No carotid or arterial bruits. No varicose veins.    Certification Status: meet standards for 2 year certificate.  Certification expires 05/30/2022

## 2020-08-13 IMAGING — CR DG ABDOMEN ACUTE W/ 1V CHEST
4 series · 4 of 4 positions shown · non-contrast
Comparison: Chest radiograph and CT 02/17/2019

CLINICAL DATA: Constipation and lower abdominal pain.

EXAM:
DG ABDOMEN ACUTE W/ 1V CHEST

[w chest pa]
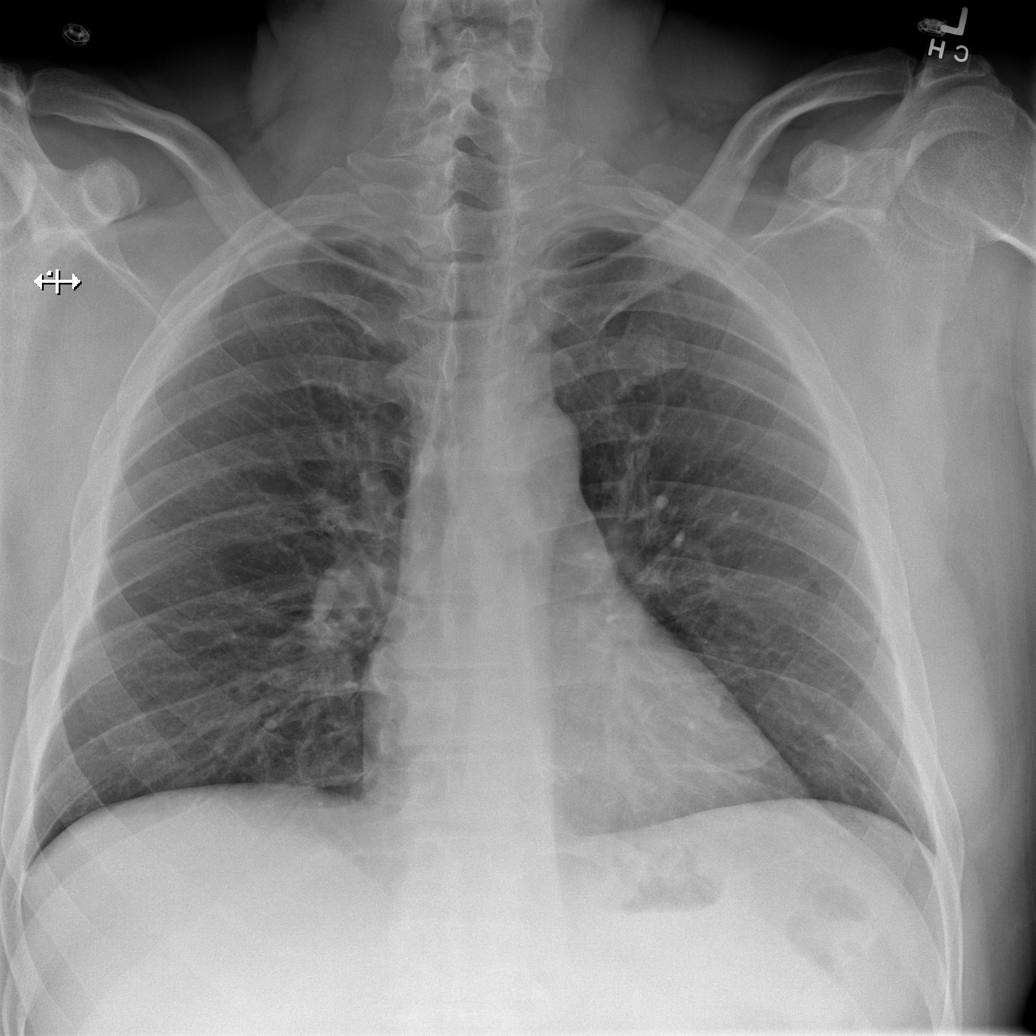

[w abdomen upright]
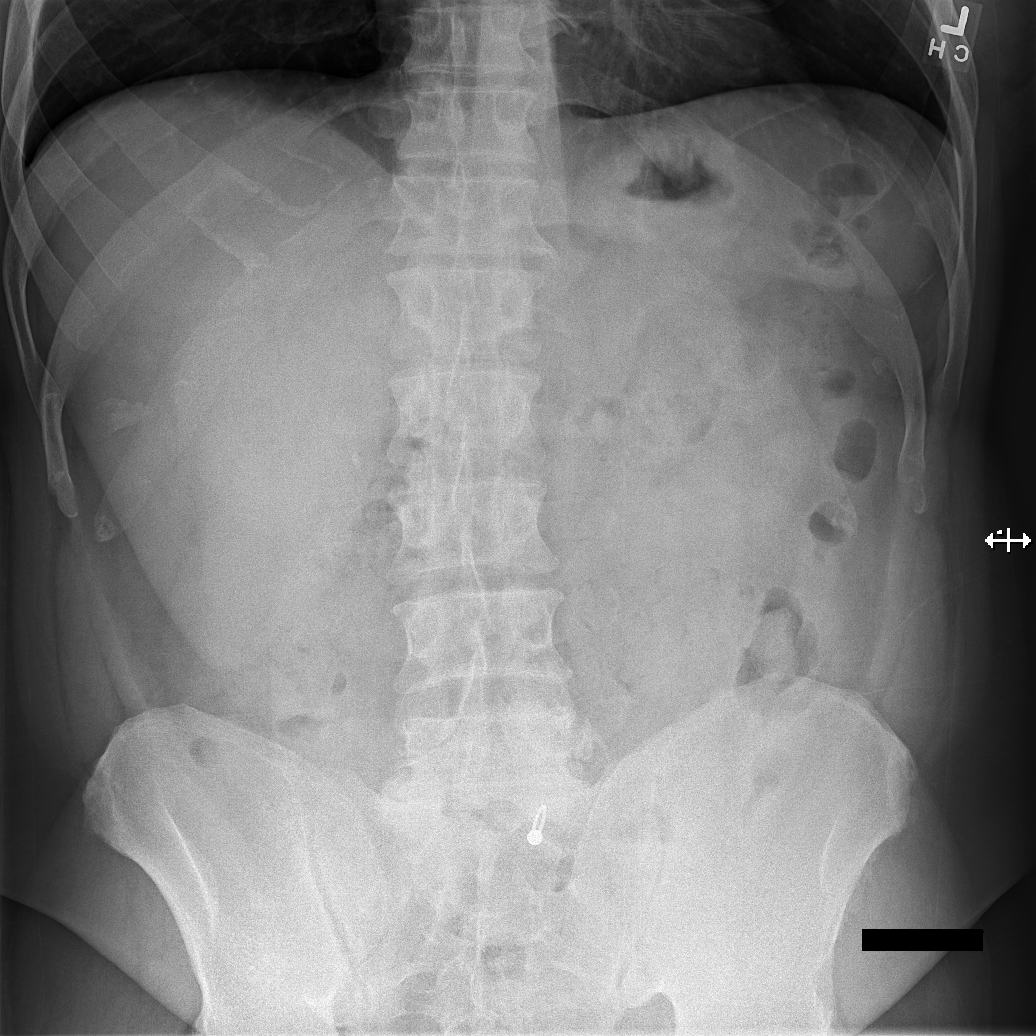

[t abdomen supine (1 of 2)]
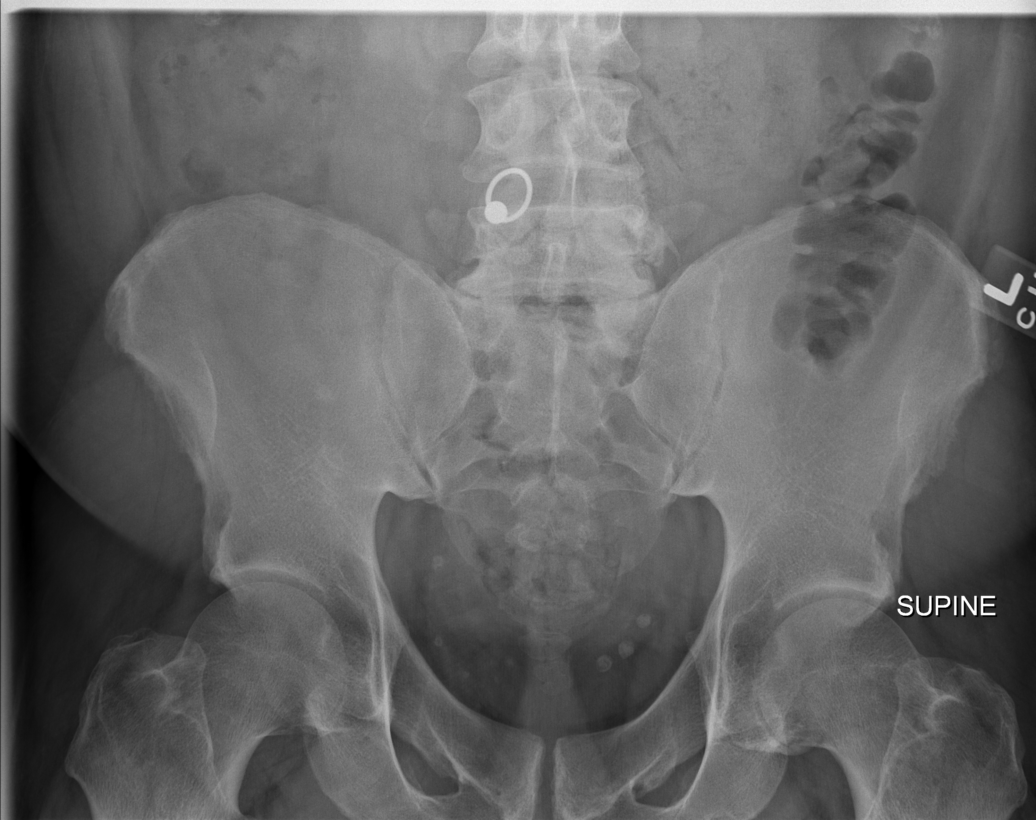

[t abdomen supine (2 of 2)]
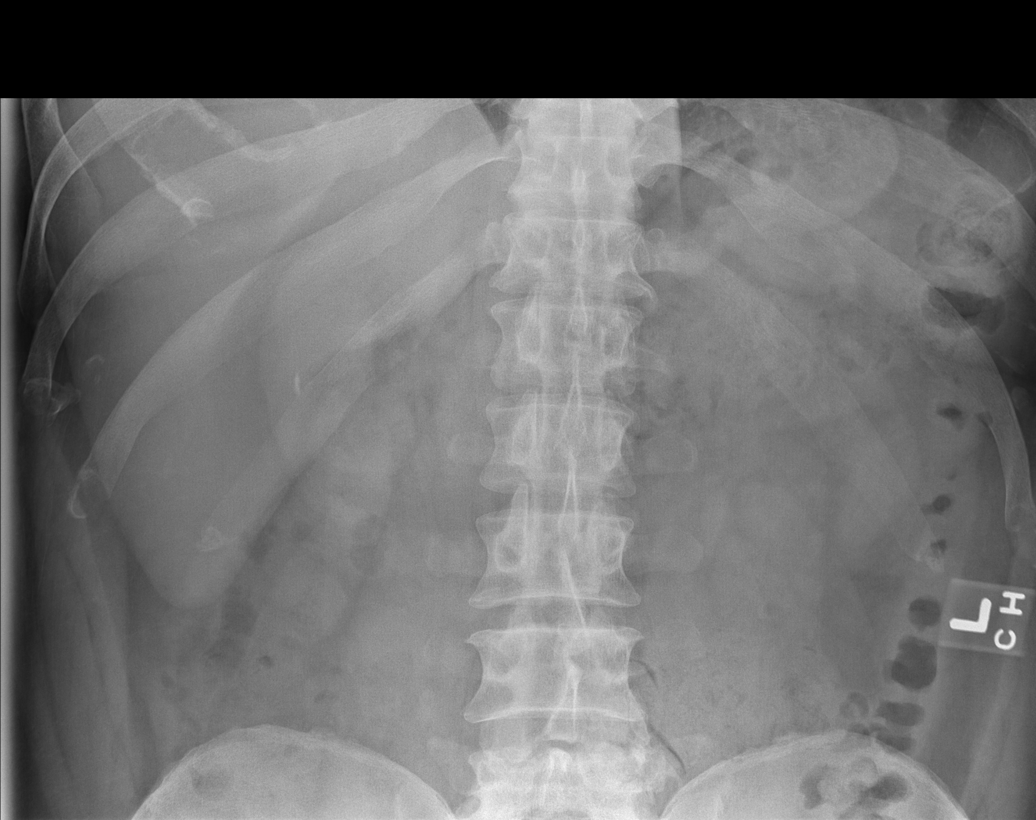

[4 of 4 positions shown; findings below may reference images not displayed]

FINDINGS: The cardiomediastinal contours are normal. The lungs are clear.
There is no free intra-abdominal air. No dilated bowel loops to
suggest obstruction. Moderate stool in the ascending and transverse
colon. Redundant sigmoid colon with moderate stool. No abnormal
rectal distention. Small right upper quadrant calcification.
Multiple pelvic phleboliths. No acute osseous abnormalities are
seen.
IMPRESSION: 1. Moderate stool burden with sigmoid colonic redundancy. No bowel
obstruction.
2. Clear lungs.

## 2020-12-22 ENCOUNTER — Other Ambulatory Visit: Payer: Self-pay | Admitting: Internal Medicine

## 2021-02-09 ENCOUNTER — Other Ambulatory Visit: Payer: Self-pay | Admitting: Internal Medicine

## 2021-02-10 LAB — COMPLETE METABOLIC PANEL WITH GFR
AG Ratio: 1.4 (calc) (ref 1.0–2.5)
ALT: 8 U/L — ABNORMAL LOW (ref 9–46)
AST: 20 U/L (ref 10–40)
Albumin: 4.2 g/dL (ref 3.6–5.1)
Alkaline phosphatase (APISO): 90 U/L (ref 36–130)
BUN: 12 mg/dL (ref 7–25)
CO2: 24 mmol/L (ref 20–32)
Calcium: 9.4 mg/dL (ref 8.6–10.3)
Chloride: 102 mmol/L (ref 98–110)
Creat: 1.13 mg/dL (ref 0.60–1.35)
GFR, Est African American: 88 mL/min/{1.73_m2} (ref >=60)
GFR, Est Non African American: 76 mL/min/{1.73_m2} (ref >=60)
Globulin: 2.9 g/dL (calc) (ref 1.9–3.7)
Glucose, Bld: 91 mg/dL (ref 65–99)
Potassium: 4.1 mmol/L (ref 3.5–5.3)
Sodium: 137 mmol/L (ref 135–146)
Total Bilirubin: 0.3 mg/dL (ref 0.2–1.2)
Total Protein: 7.1 g/dL (ref 6.1–8.1)

## 2021-02-10 LAB — LIPID PANEL
Cholesterol: 119 mg/dL (ref <200)
HDL: 33 mg/dL — ABNORMAL LOW (ref >=40)
LDL Cholesterol (Calc): 61 mg/dL (calc)
Non-HDL Cholesterol (Calc): 86 mg/dL (calc) (ref <130)
Total CHOL/HDL Ratio: 3.6 (calc) (ref <5.0)
Triglycerides: 176 mg/dL — ABNORMAL HIGH (ref <150)

## 2021-02-10 LAB — CBC
HCT: 38.2 % — ABNORMAL LOW (ref 38.5–50.0)
Hemoglobin: 12.6 g/dL — ABNORMAL LOW (ref 13.2–17.1)
MCH: 25.6 pg — ABNORMAL LOW (ref 27.0–33.0)
MCHC: 33 g/dL (ref 32.0–36.0)
MCV: 77.5 fL — ABNORMAL LOW (ref 80.0–100.0)
MPV: 9.5 fL (ref 7.5–12.5)
Platelets: 290 10*3/uL (ref 140–400)
RBC: 4.93 10*6/uL (ref 4.20–5.80)
RDW: 14.6 % (ref 11.0–15.0)
WBC: 6.1 10*3/uL (ref 3.8–10.8)

## 2021-02-10 LAB — HIV ANTIBODY (ROUTINE TESTING W REFLEX): HIV 1&2 Ab, 4th Generation: NONREACTIVE

## 2021-02-10 LAB — PSA: PSA: 0.3 ng/mL (ref <=4.0)

## 2021-02-10 LAB — C. TRACHOMATIS/N. GONORRHOEAE RNA
C. trachomatis RNA, TMA: NOT DETECTED
N. gonorrhoeae RNA, TMA: NOT DETECTED

## 2021-02-10 LAB — RPR: RPR Ser Ql: NONREACTIVE

## 2021-02-10 LAB — VITAMIN D 25 HYDROXY (VIT D DEFICIENCY, FRACTURES): Vit D, 25-Hydroxy: 31 ng/mL (ref 30–100)

## 2021-02-10 LAB — HEMOGLOBIN A1C W/OUT EAG: Hgb A1c MFr Bld: 6.4 % of total Hgb — ABNORMAL HIGH (ref <5.7)

## 2021-02-10 LAB — TSH: TSH: 0.87 mIU/L (ref 0.40–4.50)

## 2021-09-11 ENCOUNTER — Other Ambulatory Visit: Payer: Self-pay | Admitting: Internal Medicine

## 2021-09-12 LAB — URINE CULTURE
MICRO NUMBER:: 12392518
Result:: NO GROWTH
SPECIMEN QUALITY:: ADEQUATE

## 2021-09-12 LAB — C. TRACHOMATIS/N. GONORRHOEAE RNA
C. trachomatis RNA, TMA: NOT DETECTED
N. gonorrhoeae RNA, TMA: NOT DETECTED

## 2022-01-18 ENCOUNTER — Ambulatory Visit: Payer: PRIVATE HEALTH INSURANCE | Admitting: Dietician

## 2022-03-05 ENCOUNTER — Other Ambulatory Visit: Payer: Self-pay

## 2022-03-05 ENCOUNTER — Encounter: Payer: Self-pay | Admitting: Dietician

## 2022-03-05 ENCOUNTER — Encounter: Payer: PRIVATE HEALTH INSURANCE | Attending: Internal Medicine | Admitting: Dietician

## 2022-03-05 VITALS — Ht >= 80 in | Wt 381.8 lb

## 2022-03-05 DIAGNOSIS — R7303 Prediabetes: Secondary | ICD-10-CM | POA: Diagnosis not present

## 2022-03-05 DIAGNOSIS — E669 Obesity, unspecified: Secondary | ICD-10-CM | POA: Insufficient documentation

## 2022-03-05 NOTE — Progress Notes (Signed)
Medical Nutrition Therapy  ?Appointment Start time:  3299  Appointment End time:  1640 ? ?Primary concerns today: Weight Loss  ?Referral diagnosis: E66.01 - Morbid Obesity, R73.03 - Prediabetes ?Preferred learning style: No preference indicated ?Learning readiness: Change in progress ? ? ?NUTRITION ASSESSMENT  ? ?Anthropometrics  ?Ht: 6'8" ?Wt: 381.8 lbs ?Body mass index is 41.94 kg/m?. ? ?Clinical ?Medical Hx: GERD, Prtediabetes. ?Medications: Protonix ?Labs: FBG - 119, A1c- 6.4 (02/09/2021) ?Notable Signs/Symptoms: None ? ?Lifestyle & Dietary Hx ?Pt reports having knee surgery about a year ago that has begun to give them a lot of pain. Pt was going to the gym, but had to stop going due to the pain since December. Pt states the pain will subside for a couple of hours, but usually comes back if they sit for a period of time. Pt will be getting an MRI this Friday to investigate. Pt states they are going to go back to [employer] to exercise as tolerated.  ?Pt does HVAC, electrical and plumbing for work. Pt states that they are up and down at work, but also sits a lot driving from place to place. Pt reports dealing with a lot of sewage at work, so they don't eat much during the day. Pt also works as a Careers information officer, late into the night. ?Pt reports usually eating only dinner, states that they usually eat late and go straight to bed due to being so busy. ?Pt reports trying a regiment of only having liquids from 9 PM - 1 PM, and an eating window from 1 PM - 9 PM. Pt states they were able to do this for about 3 months before backsliding. Pt reports trying a water fast previously as well. ? ? ?Estimated daily fluid intake: 128 oz ?Supplements: Daily MVI ?Sleep: Sleeps well ?Stress / self-care: 8/10, work, personal life. Rides bike or lifts weights to de-stress. ?Current average weekly physical activity: ADLs ? ?24-Hr Dietary Recall ?First Meal: Green tea w/ lemon, lime, ginger and honey, Grilled chicken and  shrimp salad ?Snack: none ?Second Meal: No lunch ?Snack: none ?Third Meal: Sugar-free Neapolitan ice cream, red velvet cake ?Snack: none ?Beverages: Apple Juice, water ? ? ?NUTRITION DIAGNOSIS  ?NB-1.1 Food and nutrition-related knowledge deficit As related to Obesity.  As evidenced by BMI of 41.94 kg/m2, history of fad dieting, and only eating one meal a day. ? ? ?NUTRITION INTERVENTION  ?Nutrition education (E-1) on the following topics:  ?Educated patient on the balanced plate eating model. Recommended lunch and dinner be 1/2 non-starchy vegetables, 1/4 starches, and 1/4 protein. Recommended breakfast be a balance of starch and protein with a piece of fruit. Discussed with patient the importance of working towards hitting the proportions of the balanced plate consistently.  ?Educated patient on the importance of consistent meals throughout the day. ?Educated pt on potential food sources (high fat foods,spicy foods, high refined sugar foods, caffeine, etc.) that can trigger reflux and/or other GERD symptoms including chest pain. Educated pt to avoid eating within 2-3 hours of going to bed to minimize reflux.  ?Educated patient on the two components of energy balance: Energy in (calories), and energy out (activity). Explain the role of negative energy balance in weight loss. Discussed options with patient to achieve a negative energy balance and how to best control energy in and energy out to accommodate their lifestyle. ? ? ?Handouts Provided Include  ?Proteins food list ?Balanced Plate ? ?Learning Style & Readiness for Change ?Teaching method utilized: Visual & Auditory  ?  Demonstrated degree of understanding via: Teach Back  ?Barriers to learning/adherence to lifestyle change: None ? ?Goals Established by Pt ?Work on lowering your consumption of apple juice and begin to replace it with whole fruits. ?Work towards eating 5-6 small meals a day, about 3 hours apart! Always try to pair carbs and proteins together  when you eat. ?Look into either Premier Protein, Glucerna MAX, or Ensure MAX. ?Begin to recognize carbohydrates, proteins, and non-starchy vegetables in your food choices! ?Begin to build your meals using the proportions of the Balanced Plate. ?First, select your carb choice(s) for the meal. Make this 25% of your meal. ?Next, select your source of protein to pair with your carb choice(s). Make this another 25% of your meal. ?Finally, complete your meal with a variety of non-starchy vegetables. Make this the remaining 50% of your meal. ?Work towards the goal of 150 minutes a week. Try to spread this out over 4-5 days. ? ? ?MONITORING & EVALUATION ?Dietary intake, weekly physical activity, and meal pattern in 2 months. ? ?Next Steps  ?Patient is to follow up with RD. ? ?

## 2022-03-05 NOTE — Patient Instructions (Addendum)
Work on lowering your consumption of apple juice and begin to replace it with whole fruits. ? ?Work towards eating 5-6 small meals a day, about 3 hours apart! Always try to pair carbs and proteins together when you eat. ? ?Look into either Premier Protein, Glucerna MAX, or Ensure MAX. ? ?Begin to recognize carbohydrates, proteins, and non-starchy vegetables in your food choices! ? ?Begin to build your meals using the proportions of the Balanced Plate. ?First, select your carb choice(s) for the meal. Make this 25% of your meal. ?Next, select your source of protein to pair with your carb choice(s). Make this another 25% of your meal. ?Finally, complete your meal with a variety of non-starchy vegetables. Make this the remaining 50% of your meal. ? ?Work towards the goal of 150 minutes a week. Try to spread this out over 4-5 days. ?

## 2022-04-18 ENCOUNTER — Other Ambulatory Visit: Payer: Self-pay | Admitting: Internal Medicine

## 2022-04-19 LAB — SARS-COV-2 RNA,(COVID-19) QUALITATIVE NAAT: SARS CoV2 RNA: NOT DETECTED

## 2022-04-19 LAB — INFLUENZA A AND B AG, IMMUNOASSAY
INFLUENZA A ANTIGEN: NOT DETECTED
INFLUENZA B ANTIGEN: NOT DETECTED
MICRO NUMBER:: 13318691
SPECIMEN QUALITY:: ADEQUATE

## 2022-05-08 ENCOUNTER — Ambulatory Visit: Payer: PRIVATE HEALTH INSURANCE | Admitting: Dietician

## 2022-07-31 ENCOUNTER — Emergency Department (HOSPITAL_BASED_OUTPATIENT_CLINIC_OR_DEPARTMENT_OTHER)
Admission: EM | Admit: 2022-07-31 | Discharge: 2022-08-01 | Disposition: A | Discharge status: Home / Self Care | Payer: 59 | Attending: Emergency Medicine | Admitting: Emergency Medicine

## 2022-07-31 ENCOUNTER — Other Ambulatory Visit: Payer: Self-pay

## 2022-07-31 ENCOUNTER — Emergency Department (HOSPITAL_BASED_OUTPATIENT_CLINIC_OR_DEPARTMENT_OTHER): Payer: 59

## 2022-07-31 ENCOUNTER — Encounter (HOSPITAL_BASED_OUTPATIENT_CLINIC_OR_DEPARTMENT_OTHER): Payer: Self-pay

## 2022-07-31 DIAGNOSIS — N3091 Cystitis, unspecified with hematuria: Secondary | ICD-10-CM

## 2022-07-31 DIAGNOSIS — R319 Hematuria, unspecified: Secondary | ICD-10-CM | POA: Diagnosis present

## 2022-07-31 LAB — BASIC METABOLIC PANEL
Anion gap: 12 (ref 5–15)
BUN: 12 mg/dL (ref 6–20)
CO2: 24 mmol/L (ref 22–32)
Calcium: 10.2 mg/dL (ref 8.9–10.3)
Chloride: 101 mmol/L (ref 98–111)
Creatinine, Ser: 1.27 mg/dL — ABNORMAL HIGH (ref 0.61–1.24)
GFR, Estimated: >60 mL/min (ref >60)
Glucose, Bld: 100 mg/dL — ABNORMAL HIGH (ref 70–99)
Potassium: 4 mmol/L (ref 3.5–5.1)
Sodium: 137 mmol/L (ref 135–145)

## 2022-07-31 LAB — URINALYSIS, ROUTINE W REFLEX MICROSCOPIC
Bilirubin Urine: NEGATIVE
Glucose, UA: NEGATIVE mg/dL
Ketones, ur: NEGATIVE mg/dL
Nitrite: NEGATIVE
RBC / HPF: >50 RBC/hpf — ABNORMAL HIGH (ref 0–5)
Specific Gravity, Urine: 1.023 (ref 1.005–1.030)
pH: 6.5 (ref 5.0–8.0)

## 2022-07-31 LAB — CBC
HCT: 42.5 % (ref 39.0–52.0)
Hemoglobin: 13.6 g/dL (ref 13.0–17.0)
MCH: 25.5 pg — ABNORMAL LOW (ref 26.0–34.0)
MCHC: 32 g/dL (ref 30.0–36.0)
MCV: 79.7 fL — ABNORMAL LOW (ref 80.0–100.0)
Platelets: 271 10*3/uL (ref 150–400)
RBC: 5.33 MIL/uL (ref 4.22–5.81)
RDW: 15.6 % — ABNORMAL HIGH (ref 11.5–15.5)
WBC: 7.9 10*3/uL (ref 4.0–10.5)
nRBC: 0 % (ref 0.0–0.2)

## 2022-07-31 NOTE — ED Provider Notes (Signed)
[facility] EMERGENCY DEPT Provider Note   CSN: [id number] Arrival date & time: 07/31/22  1708     History  Chief Complaint  Patient presents with   Hematuria    Mike Reyes is a 51 y.o. male.  Patient is a 51 year old male with past medical history of GERD.  Patient presenting today with complaints of hematuria.  He states he passed bright red blood tinged with blood clots this evening.  He did describe some discomfort when he urinated, but denies any flank pain, fevers, or chills.  Patient is not on any blood thinners.  He denies history of kidney stones.  There are no aggravating or alleviating factors.  The history is provided by the patient.       Home Medications Prior to Admission medications   Medication Sig Start Date End Date Taking? Authorizing Provider  dicyclomine (BENTYL) 20 MG tablet TAKE 1 TABLET(20 MG) BY MOUTH THREE TIMES DAILY AS NEEDED FOR SPASMS 12/22/20   Pyrtle, Lajuan Lines, MD  nabumetone (RELAFEN) 750 MG tablet Take 750 mg by mouth 2 (two) times daily as needed. 11/29/21   [provider]  pantoprazole (PROTONIX) 40 MG tablet Take 1 tablet (40 mg total) by mouth 2 (two) times daily. 11/26/19   Pyrtle, Lajuan Lines, MD      Allergies    Gabapentin and Penicillins    Review of Systems   Review of Systems  All other systems reviewed and are negative.   Physical Exam Updated Vital Signs BP 131/79   Pulse 66   Temp 98.8 F (37.1 C) (Oral)   Resp 17   Ht '6\' 8"'$  (2.032 m)   Wt (!) 171 kg   SpO2 97%   BMI 41.42 kg/m  Physical Exam Vitals and nursing note reviewed.  Constitutional:      General: He is not in acute distress.    Appearance: He is well-developed. He is not diaphoretic.  HENT:     Head: Normocephalic and atraumatic.  Cardiovascular:     Rate and Rhythm: Normal rate and regular rhythm.     Heart sounds: No murmur heard.    No friction rub.  Pulmonary:     Effort: Pulmonary effort is normal. No respiratory distress.      Breath sounds: Normal breath sounds. No wheezing or rales.  Abdominal:     General: Bowel sounds are normal. There is no distension.     Palpations: Abdomen is soft.     Tenderness: There is no abdominal tenderness.  Musculoskeletal:        General: Normal range of motion.     Cervical back: Normal range of motion and neck supple.  Skin:    General: Skin is warm and dry.  Neurological:     Mental Status: He is alert and oriented to person, place, and time.     Coordination: Coordination normal.     ED Results / Procedures / Treatments   Labs (all labs ordered are listed, but only abnormal results are displayed) Labs Reviewed  URINALYSIS, ROUTINE W REFLEX MICROSCOPIC - Abnormal; Notable for the following components:      Result Value   Hgb urine dipstick LARGE (*)    Protein, ur TRACE (*)    Leukocytes,Ua SMALL (*)    RBC / HPF >50 (*)    All other components within normal limits  BASIC METABOLIC PANEL - Abnormal; Notable for the following components:   Glucose, Bld 100 (*)    Creatinine, Ser  1.27 (*)    All other components within normal limits  CBC - Abnormal; Notable for the following components:   MCV 79.7 (*)    MCH 25.5 (*)    RDW 15.6 (*)    All other components within normal limits    EKG None  Radiology No results found.  Procedures Procedures    Medications Ordered in ED Medications - No data to display  ED Course/ Medical Decision Making/ A&P  Patient presenting here with complaints of painless hematuria, the etiology of which I am uncertain.  Urinalysis shows mainly blood, but no clear infection.  I did obtain a CT scan to rule out kidney stone or renal mass.  This was read as unremarkable.  Patient's abdomen is benign.  He had a normal PSA 18 months ago.  Patient will be treated presumptively for hemorrhagic cystitis with Keflex.  I will have him follow-up with urology and return to the ER if symptoms worsen or change.  Final Clinical Impression(s)  / ED Diagnoses Final diagnoses:  None    Rx / DC Orders ED Discharge Orders     None         Veryl Speak, MD 08/01/22 0002

## 2022-07-31 NOTE — ED Triage Notes (Signed)
Patient here POV from Home.  Endorses Episode of Hematuria today approximately 3-4 Hours ago. No other Symptoms besides Mild Discomfort when urinating.  No Other Pain. No Fevers. No N/V/D.   NAD Noted during Triage. A&Ox4. GCS 15. Ambulatory.

## 2022-08-01 MED ORDER — CEPHALEXIN 250 MG PO CAPS
500.0000 mg | ORAL_CAPSULE | Freq: Once | ORAL | Status: AC
  Administered 2022-08-01: 500 mg via ORAL
  Filled 2022-08-01: qty 2

## 2022-08-01 MED ORDER — CEPHALEXIN 500 MG PO CAPS: 500.0000 mg | ORAL_CAPSULE | Freq: Four times a day (QID) | ORAL | 0 refills | Status: AC

## 2022-08-01 NOTE — Discharge Instructions (Signed)
Begin taking Keflex as prescribed.  Drink plenty of fluids and get plenty of rest.  Follow-up with urology in the next few days, and return to the ER if you develop any new and/or concerning symptoms.

## 2022-08-10 ENCOUNTER — Other Ambulatory Visit: Payer: Self-pay | Admitting: Internal Medicine

## 2022-08-12 LAB — URINE CULTURE
MICRO NUMBER:: 13800195
Result:: NO GROWTH
SPECIMEN QUALITY:: ADEQUATE
